# Patient Record
Sex: Female | Born: 1970
Health system: Southern US, Community
[De-identification: ages and names within clinical notes are randomized; demographics above are authoritative.]

## PROBLEM LIST (undated history)

## (undated) DIAGNOSIS — R51 Headache: Secondary | ICD-10-CM

## (undated) DIAGNOSIS — R519 Headache, unspecified: Secondary | ICD-10-CM

## (undated) DIAGNOSIS — I10 Essential (primary) hypertension: Secondary | ICD-10-CM

## (undated) DIAGNOSIS — I729 Aneurysm of unspecified site: Secondary | ICD-10-CM

## (undated) HISTORY — PX: OTHER SURGICAL HISTORY: SHX169

## (undated) HISTORY — DX: Headache: R51

## (undated) HISTORY — PX: FOOT SURGERY: SHX648

## (undated) HISTORY — PX: BARIATRIC SURGERY: SHX1103

## (undated) HISTORY — PX: ABDOMINAL HYSTERECTOMY: SHX81

## (undated) HISTORY — DX: Aneurysm of unspecified site: I72.9

## (undated) HISTORY — PX: CEREBRAL ANEURYSM REPAIR: SHX164

## (undated) HISTORY — PX: HYSTERECTOMY ABDOMINAL WITH SALPINGECTOMY: SHX6725

## (undated) HISTORY — DX: Headache, unspecified: R51.9

---

## 2017-02-26 ENCOUNTER — Emergency Department (HOSPITAL_COMMUNITY): Payer: 59

## 2017-02-26 ENCOUNTER — Emergency Department (HOSPITAL_COMMUNITY)
Admission: EM | Admit: 2017-02-26 | Discharge: 2017-02-26 | Disposition: A | Discharge status: Home / Self Care | Payer: 59 | Attending: Emergency Medicine | Admitting: Emergency Medicine

## 2017-02-26 ENCOUNTER — Encounter (HOSPITAL_COMMUNITY): Payer: Self-pay | Admitting: Emergency Medicine

## 2017-02-26 DIAGNOSIS — Z79899 Other long term (current) drug therapy: Secondary | ICD-10-CM | POA: Diagnosis not present

## 2017-02-26 DIAGNOSIS — I10 Essential (primary) hypertension: Secondary | ICD-10-CM | POA: Diagnosis not present

## 2017-02-26 DIAGNOSIS — J069 Acute upper respiratory infection, unspecified: Secondary | ICD-10-CM

## 2017-02-26 DIAGNOSIS — R05 Cough: Secondary | ICD-10-CM | POA: Diagnosis present

## 2017-02-26 HISTORY — DX: Essential (primary) hypertension: I10

## 2017-02-26 MED ORDER — ACETAMINOPHEN 325 MG PO TABS
650.0000 mg | ORAL_TABLET | Freq: Once | ORAL | Status: AC | PRN
  Administered 2017-02-26: 650 mg via ORAL

## 2017-02-26 MED ORDER — GUAIFENESIN 100 MG/5ML PO LIQD: 100.0000 mg | ORAL | 0 refills | Status: DC | PRN

## 2017-02-26 MED ORDER — OXYMETAZOLINE HCL 0.05 % NA SOLN: 1.0000 | Freq: Two times a day (BID) | NASAL | 0 refills | Status: DC

## 2017-02-26 MED ORDER — AZITHROMYCIN 250 MG PO TABS: 250.0000 mg | ORAL_TABLET | Freq: Every day | ORAL | 0 refills | Status: DC

## 2017-02-26 MED ORDER — ACETAMINOPHEN 325 MG PO TABS
ORAL_TABLET | ORAL | Status: AC
  Filled 2017-02-26: qty 2

## 2017-02-26 NOTE — ED Notes (Signed)
Declined W/C at D/C and was escorted to lobby by RN. 

## 2017-02-26 NOTE — Discharge Instructions (Signed)
Take your medications as prescribed. I also recommend taking Tylenol and ibuprofen as prescribed over-the-counter, alternating between doses every 3-4 hours. Continue drinking fluids at home to remain hydrated. I recommend eating a bland diet for the next few days and taper symptoms have improved. °Follow-up with your primary care provider in the next 3-4 days if symptoms have not improved. °Return to the emergency department if symptoms worsen or new onset of headache, neck stiffness, difficulty breathing, coughing up blood, chest pain, abdominal pain, vomiting, unable to keep fluids down.  °

## 2017-02-26 NOTE — ED Triage Notes (Signed)
p states "on Tuesday I having bad headaches, congestion, cough, aching all over". Pt had fever 100.6. UC did flu swab which was negative. Pt denies headache at this time. They did a CT scan at [facility] which was negative.

## 2017-02-26 NOTE — ED Provider Notes (Signed)
[facility]-EMERGENCY DEPT Provider Note   CSN: [id number] Arrival date & time: 02/26/17  1534 By signing my name below, I, Drue Flirt, attest that this documentation has been prepared under the direction and in the presence of non physician provider,Aneli Zara, PA-C . Electronically Signed: Drue Flirt, Scribe. 02/26/2017. 5:34 PM  History   Chief Complaint Chief Complaint  Patient presents with  . Cough  . Fever   HPI  Candice Hines is a 46 y.o. female with a history of seasonal allergies and HTN who presents to the Emergency Department complaining of intermittent, gradually worsening cough productive of sputum onset 5 days ago. She reports associated watery eyes, congestion, bilateral sinus pain and pressure, generalized body aches, and rhinorrhea. Pt also expresses that her symptoms began with a headache which has since resolved. Pt states she was seen at Urgent Care five days ago for the same where she had a negative flu test, normal head CT and was rx Promethazine DM, tessalon pearls for viral URI. She reports her sxs have worsened since her initial evaluation at urgent care. Pt has taken these medications and Tylenol with no significant relief of symptoms. She is unsure of any fever, but is febrile during exam. She denies any itching to her eyes, sore throat, abdominal pain, SOB, vomiting, diarrhea, otalgia, or ear discharge. No recent sick contact. She also reports taking Allegra for her allergies.   The history is provided by the patient. No language interpreter was used.   Past Medical History:  Diagnosis Date  . Hypertension     There are no active problems to display for this patient.   History reviewed. No pertinent surgical history.  OB History    No data available     Home Medications    Prior to Admission medications   Medication Sig Start Date End Date Taking? Authorizing Provider  azithromycin (ZITHROMAX) 250 MG tablet Take 1 tablet (250 mg total) by mouth  daily. Take first 2 tablets together, then 1 every day until finished. 02/26/17   Barrett Henle, PA-C  guaiFENesin (ROBITUSSIN) 100 MG/5ML liquid Take 5-10 mLs (100-200 mg total) by mouth every 4 (four) hours as needed for cough. 02/26/17   Barrett Henle, PA-C  oxymetazoline (AFRIN NASAL SPRAY) 0.05 % nasal spray Place 1 spray into both nostrils 2 (two) times daily. Spray once into each nostril twice daily for up to the next 3 days. Do not use for more than 3 days to prevent rebound rhinorrhea. 02/26/17   Barrett Henle, PA-C   Family History No family history on file.  Social History Social History  Substance Use Topics  . Smoking status: Not on file  . Smokeless tobacco: Not on file  . Alcohol use No   Allergies   Flexeril [cyclobenzaprine] and Tramadol  Review of Systems Review of Systems  Constitutional: Negative for fever.  HENT: Positive for congestion, rhinorrhea, sinus pain and sinus pressure. Negative for ear pain and sore throat.   Eyes: Negative for itching.  Respiratory: Positive for cough. Negative for shortness of breath.   Cardiovascular: Negative for chest pain.  Gastrointestinal: Negative for abdominal pain, diarrhea and vomiting.  Musculoskeletal: Positive for myalgias.  Neurological: Positive for headaches.   Physical Exam Updated Vital Signs BP 108/76 (BP Location: Right Arm)   Pulse 61   Temp 99.1 F (37.3 C) (Oral)   Resp 20   SpO2 100%   Physical Exam  Constitutional: She is oriented to person, place, and time. She  appears well-developed and well-nourished. No distress.  HENT:  Head: Normocephalic and atraumatic.  Right Ear: Tympanic membrane normal.  Left Ear: Tympanic membrane normal.  Nose: Rhinorrhea present. Right sinus exhibits no maxillary sinus tenderness and no frontal sinus tenderness. Left sinus exhibits no maxillary sinus tenderness and no frontal sinus tenderness.  Mouth/Throat: Uvula is midline, oropharynx is  clear and moist and mucous membranes are normal. No oropharyngeal exudate, posterior oropharyngeal edema, posterior oropharyngeal erythema or tonsillar abscesses. No tonsillar exudate.  Eyes: Conjunctivae and EOM are normal. Pupils are equal, round, and reactive to light. Right eye exhibits no discharge. Left eye exhibits no discharge. No scleral icterus.  Neck: Normal range of motion. Neck supple.  Cardiovascular: Normal rate, regular rhythm, normal heart sounds and intact distal pulses.   Pulmonary/Chest: Effort normal and breath sounds normal. No respiratory distress. She has no wheezes. She has no rales. She exhibits no tenderness.  Abdominal: Soft. Bowel sounds are normal. She exhibits no distension and no mass. There is no tenderness. There is no rebound and no guarding. No hernia.  Musculoskeletal: Normal range of motion. She exhibits no edema or tenderness.  Lymphadenopathy:    She has no cervical adenopathy.  Neurological: She is alert and oriented to person, place, and time.  Skin: Skin is warm and dry. No rash noted. She is not diaphoretic.  Nursing note and vitals reviewed.  ED Treatments / Results  DIAGNOSTIC STUDIES:  Oxygen Saturation is 98% on RA, normal by my interpretation.    COORDINATION OF CARE:  5:05 PM . Discussed treatment plan with medications with pt at bedside and pt agreed to plan.  Labs (all labs ordered are listed, but only abnormal results are displayed) Labs Reviewed - No data to display  EKG  EKG Interpretation None      Radiology Dg Chest 2 View  Result Date: 02/26/2017 CLINICAL DATA:  Throbbing headache.  Backache.  Productive cough. EXAM: CHEST  2 VIEW COMPARISON:  None. FINDINGS: The heart size and mediastinal contours are within normal limits. Both lungs are clear. The visualized skeletal structures are unremarkable. IMPRESSION: No active cardiopulmonary disease. Electronically Signed   By: Gerome Sam III M.D   On: 02/26/2017 16:27     Procedures Procedures (including critical care time)  Medications Ordered in ED Medications  acetaminophen (TYLENOL) 325 MG tablet (not administered)  acetaminophen (TYLENOL) tablet 650 mg (650 mg Oral Given 02/26/17 1559)   Initial Impression / Assessment and Plan / ED Course  I have reviewed the triage vital signs and the nursing notes.  Pertinent labs & imaging results that were available during my care of the patient were reviewed by me and considered in my medical decision making (see chart for details).     Patient presents with worsening cough, nasal congestion, sinus pressure and body aches for the past 6 days. Reports initially being evaluated at urgent care with negative flu test, normal head CT for headache which has since resolved. Initial vitals showed temp 100/6 in triage, pt given tylenol, remaining vitals stable. Exam revealed rhinorrhea. MMM. Lungs clear to auscultation bilaterally. RRR. Abdomen soft, nontender. Chest x-ray negative. Suspect patient's symptoms are likely due to viral URI with associated seasonal allergies however due to patient reporting worsening symptoms over the past 5-6 days will discharged home with Z-Pak for possible sinus infection. Patient also given prescription for decongestant and antitussive. Advised patient to continue taking Tylenol and ibuprofen as needed for fever and bodyaches. Advised patient to follow up  with PCP in 2-3 days. Discussed return precautions.   Final Clinical Impressions(s) / ED Diagnoses   Final diagnoses:  Upper respiratory tract infection, unspecified type    New Prescriptions Discharge Medication List as of 02/26/2017  5:22 PM    START taking these medications   Details  azithromycin (ZITHROMAX) 250 MG tablet Take 1 tablet (250 mg total) by mouth daily. Take first 2 tablets together, then 1 every day until finished., Starting Sat 02/26/2017, Print    guaiFENesin (ROBITUSSIN) 100 MG/5ML liquid Take 5-10 mLs (100-200  mg total) by mouth every 4 (four) hours as needed for cough., Starting Sat 02/26/2017, Print    oxymetazoline (AFRIN NASAL SPRAY) 0.05 % nasal spray Place 1 spray into both nostrils 2 (two) times daily. Spray once into each nostril twice daily for up to the next 3 days. Do not use for more than 3 days to prevent rebound rhinorrhea., Starting Sat 02/26/2017, Print       I personally performed the services described in this documentation, which was scribed in my presence. The recorded information has been reviewed and is accurate.    Satira Sark [place], [place] 02/26/17 1852    Mancel Bale, MD 02/27/17 1225

## 2017-03-21 ENCOUNTER — Ambulatory Visit (INDEPENDENT_AMBULATORY_CARE_PROVIDER_SITE_OTHER): Payer: 59 | Admitting: Neurology

## 2017-03-21 ENCOUNTER — Encounter: Payer: Self-pay | Admitting: Neurology

## 2017-03-21 VITALS — BP 150/85 | HR 63 | Resp 16 | Ht 67.0 in | Wt 194.0 lb

## 2017-03-21 DIAGNOSIS — G9389 Other specified disorders of brain: Secondary | ICD-10-CM | POA: Diagnosis not present

## 2017-03-21 DIAGNOSIS — I671 Cerebral aneurysm, nonruptured: Secondary | ICD-10-CM | POA: Insufficient documentation

## 2017-03-21 NOTE — Progress Notes (Signed)
[facility]    Provider:  Dr Lucia Gaskins Referring Provider: Rayburn Ma, MD Primary Care Physician:  Rayburn Ma, MD  CC:  Encephalomalacia  HPI:  Candice Hines is a 46 y.o. female here as a referral from Dr. Alinda Sierras for encephalomalacia. PMHx HTN and DM. At the beginning of the month she was feeling flu-ish with headache and coughing and weak and fatigued and she was treated with antibiotics. CT of the head showed remote encephalomalacia (I don;t have images ot review) and per report nothing acute but associated with previous aneurysm stenting. She has headaches that haven't changed in quality ot frequency or severity. Worse in stressful situations 3-4 a year and easily treatable with OTC meds. She has numbness in the left arm which started even before the left aneurysm, not worsening. No weakness in the arm. She has bilateral CTS in the hands. No other focal neurologic deficits, associated symptoms, inciting events or modifiable factors.  Reviewed notes, labs and imaging from outside physicians, which showed:  HgbA1c 5.5 01/20/2017 (6.1 in 2015 and 6.9 in 2014).   Reviewed primary care notes, patient was seen for headache in the ED in late March. She has a previous aneurysm clipping and a focus of encephalomalacia in the left posterior frontal area related to hx of aneurysm.   Reviewed CT of the head report 01/2017:  FINDINGS:  There has been a right frontal craniotomy. There is an aneurysm clip at the base of the brain on the right side, most likely on a posterior communicating artery. There is a small focus of encephalomalacia in the left posterior frontal white matter.No hemorrhage No hydrocephalus  Review of Systems: Patient complains of symptoms per HPI as well as the following symptoms: headache, numbness, blurred vision, allergies. Pertinent negatives per HPI. All others negative.   Social History   Social History  . Marital status: Single    Spouse name: N/A    . Number of children: 2  . Years of education: Assoc   Occupational History  . [employer]    Social History Main Topics  . Smoking status: Never Smoker  . Smokeless tobacco: Never Used  . Alcohol use No  . Drug use: No  . Sexual activity: Not on file   Other Topics Concern  . Not on file   Social History Narrative   Drinks caffeine daily    Family History  Problem Relation Age of Onset  . Aneurysm Neg Hx   . Headache Neg Hx     Past Medical History:  Diagnosis Date  . Aneurysm (HCC)   . Headache   . Hypertension     Past Surgical History:  Procedure Laterality Date  . CEREBRAL ANEURYSM REPAIR    . CESAREAN SECTION     x2  . FOOT SURGERY    . HYSTERECTOMY ABDOMINAL WITH SALPINGECTOMY    . SIPS-weight loss sx      Current Outpatient Prescriptions  Medication Sig Dispense Refill  . Biotin 10 MG TABS Take by mouth.    . Calcium 500-100 MG-UNIT CHEW Calcium 500    . Cholecalciferol (VITAMIN D PO) Take by mouth.    . dexlansoprazole (DEXILANT) 60 MG capsule Dexilant 60 mg capsule, delayed release    . ferrous sulfate 325 (65 FE) MG tablet Take by mouth.    . fexofenadine (ALLEGRA) 180 MG tablet Take by mouth.    . Multiple Vitamins-Minerals (MULTIVITAMIN ADULT PO) multivitamin    . valACYclovir (VALTREX) 1000 MG tablet Take by  mouth.     No current facility-administered medications for this visit.     Allergies as of 03/21/2017 - Review Complete 03/21/2017  Allergen Reaction Noted  . Flexeril [cyclobenzaprine]  02/26/2017  . Tramadol  02/26/2017    Vitals: BP (!) 150/85   Pulse 63   Resp 16   Ht 5\' 7"  (1.702 m)   Wt 194 lb (88 kg)   BMI 30.38 kg/m  Last Weight:  Wt Readings from Last 1 Encounters:  03/21/17 194 lb (88 kg)   Last Height:   Ht Readings from Last 1 Encounters:  03/21/17 5\' 7"  (1.702 m)   Physical exam: Exam: Gen: NAD, conversant, well nourised, obese, well groomed                     CV: RRR, no MRG. No Carotid Bruits. No  peripheral edema, warm, nontender Eyes: Conjunctivae clear without exudates or hemorrhage  Neuro: Detailed Neurologic Exam  Speech:    Speech is normal; fluent and spontaneous with normal comprehension.  Cognition:    The patient is oriented to person, place, and time;     recent and remote memory intact;     language fluent;     normal attention, concentration,     fund of knowledge Cranial Nerves:    The pupils are equal, round, and reactive to light. The fundi are normal and spontaneous venous pulsations are present. Visual fields are full to finger confrontation. Extraocular movements are intact. Trigeminal sensation is intact and the muscles of mastication are normal. The face is symmetric. The palate elevates in the midline. Hearing intact. Voice is normal. Shoulder shrug is normal. The tongue has normal motion without fasciculations.   Coordination:    Normal finger to nose and heel to shin. Normal rapid alternating movements.   Gait:    Heel-toe and tandem gait are normal.   Motor Observation:    No asymmetry, no atrophy, and no involuntary movements noted. Tone:    Normal muscle tone.    Posture:    Posture is normal. normal erect    Strength:    Strength is V/V in the upper and lower limbs.      Sensation: intact to LT     Reflex Exam:  DTR's:    Deep tendon reflexes in the upper and lower extremities are normal bilaterally.   Toes:    The toes are downgoing bilaterally.   Clonus:    Clonus is absent.       Assessment/Plan:  46 year old with right aneurysm stenting in 1999, CT of the head last month showed right frontal craniotomy, an aneurysm clip at the base of the brain on the right side, most likely on a posterior communicating artery and a small focus of encephalomalacia in the left posterior frontal white matter. I don't have the images to review, [facility] and if further workup needed will contact patient. Paresthesias in  the left hand/arm: Likely CTS, better with brace, discussed conservative measures. Neuro exam non focal. No new symptoms.   Candice DeanAntonia Nairobi Gustafson, MD  [facility] [address] [place], [place] [id number]  Phone [phone] Fax [phone]

## 2017-03-21 NOTE — Patient Instructions (Signed)
Remember to drink plenty of fluid, eat healthy meals and do not skip any meals. Try to eat protein with a every meal and eat a healthy snack such as fruit or nuts in between meals. Try to keep a regular sleep-wake schedule and try to exercise daily, particularly in the form of walking, 20-30 minutes a day, if you can.   As far as diagnostic testing: Will review CD and call if further testing warranted  Our phone number is [phone]. We also have an after hours call service for urgent matters and there is a physician on-call for urgent questions. For any emergencies you know to call 911 or go to the nearest emergency room

## 2017-03-23 ENCOUNTER — Telehealth: Payer: Self-pay | Admitting: *Deleted

## 2017-03-23 NOTE — Telephone Encounter (Addendum)
Pt will bring a copy of CD by here on  Thursday.R/c pt CD on 03/24/17 Cd on [place] desk.

## 2017-03-30 NOTE — Telephone Encounter (Signed)
CD of head CT placed in Dr. Trevor MaceAhern's box for review. Pt would like disc returned to her.

## 2017-07-20 ENCOUNTER — Emergency Department (HOSPITAL_BASED_OUTPATIENT_CLINIC_OR_DEPARTMENT_OTHER)
Admission: EM | Admit: 2017-07-20 | Discharge: 2017-07-20 | Disposition: A | Discharge status: Home / Self Care | Payer: 59 | Attending: Emergency Medicine | Admitting: Emergency Medicine

## 2017-07-20 ENCOUNTER — Emergency Department (HOSPITAL_COMMUNITY): Admission: EM | Admit: 2017-07-20 | Discharge: 2017-07-20 | Discharge status: Against Medical Advice | Payer: 59

## 2017-07-20 ENCOUNTER — Encounter (HOSPITAL_BASED_OUTPATIENT_CLINIC_OR_DEPARTMENT_OTHER): Payer: Self-pay

## 2017-07-20 ENCOUNTER — Emergency Department (HOSPITAL_BASED_OUTPATIENT_CLINIC_OR_DEPARTMENT_OTHER): Payer: 59

## 2017-07-20 DIAGNOSIS — R51 Headache: Secondary | ICD-10-CM | POA: Insufficient documentation

## 2017-07-20 DIAGNOSIS — R519 Headache, unspecified: Secondary | ICD-10-CM

## 2017-07-20 DIAGNOSIS — Z79899 Other long term (current) drug therapy: Secondary | ICD-10-CM | POA: Diagnosis not present

## 2017-07-20 DIAGNOSIS — I10 Essential (primary) hypertension: Secondary | ICD-10-CM | POA: Insufficient documentation

## 2017-07-20 MED ORDER — DEXAMETHASONE SODIUM PHOSPHATE 10 MG/ML IJ SOLN
10.0000 mg | Freq: Once | INTRAMUSCULAR | Status: AC
  Administered 2017-07-20: 10 mg via INTRAVENOUS
  Filled 2017-07-20: qty 1

## 2017-07-20 MED ORDER — KETOROLAC TROMETHAMINE 30 MG/ML IJ SOLN
30.0000 mg | Freq: Once | INTRAMUSCULAR | Status: AC
  Administered 2017-07-20: 30 mg via INTRAVENOUS
  Filled 2017-07-20: qty 1

## 2017-07-20 MED ORDER — DIPHENHYDRAMINE HCL 50 MG/ML IJ SOLN
25.0000 mg | Freq: Once | INTRAMUSCULAR | Status: AC
  Administered 2017-07-20: 25 mg via INTRAVENOUS
  Filled 2017-07-20: qty 1

## 2017-07-20 MED ORDER — METOCLOPRAMIDE HCL 5 MG/ML IJ SOLN
10.0000 mg | Freq: Once | INTRAMUSCULAR | Status: AC
  Administered 2017-07-20: 10 mg via INTRAVENOUS
  Filled 2017-07-20: qty 2

## 2017-07-20 NOTE — ED Notes (Signed)
Discussed case with EDP James-order for CT head

## 2017-07-20 NOTE — ED Provider Notes (Signed)
[facility]   CSN: [id number] Arrival date & time: 07/20/17  1903     History   Chief Complaint Chief Complaint  Patient presents with  . Headache    HPI Candice Hines is a 46 y.o. female. Chief complaint is headache  HPI:  46 year old female. Headache for 24 hours. She became concerned because his right-sided throbbing headache. She has a history of a previous "leaking aneurysm". She underwent aneurysm clipping in 1999. Has been asymptomatic since then other than occasional mild headaches. States she had migraine headaches prior to her aneurysm. No thunderclap event. No associated nausea and vomiting. Right-sided throbbing. No vision changes. No neck pain. No fever. No numbness weakness or neurological symptoms.  Past Medical History:  Diagnosis Date  . Aneurysm (HCC)   . Headache   . Hypertension     Patient Active Problem List   Diagnosis Date Noted  . Cerebral aneurysm 03/21/2017    Past Surgical History:  Procedure Laterality Date  . CEREBRAL ANEURYSM REPAIR    . CESAREAN SECTION     x2  . FOOT SURGERY    . HYSTERECTOMY ABDOMINAL WITH SALPINGECTOMY    . SIPS-weight loss sx      OB History    No data available       Home Medications    Prior to Admission medications   Medication Sig Start Date End Date Taking? Authorizing Provider  magnesium 30 MG tablet Take 30 mg by mouth 2 (two) times daily.   Yes [provider]  VITAMIN A OP Apply to eye.   Yes [provider]  Biotin 10 MG TABS Take by mouth.    [provider]  Calcium 500-100 MG-UNIT CHEW Calcium 500    [provider]  Cholecalciferol (VITAMIN D PO) Take by mouth.    [provider]  dexlansoprazole (DEXILANT) 60 MG capsule Dexilant 60 mg capsule, delayed release    [provider]  ferrous sulfate 325 (65 FE) MG tablet Take by mouth.    [provider]  fexofenadine (ALLEGRA) 180 MG tablet Take by mouth.     [provider]  Multiple Vitamins-Minerals (MULTIVITAMIN ADULT PO) multivitamin    [provider]  valACYclovir (VALTREX) 1000 MG tablet Take by mouth.    [provider]    Family History Family History  Problem Relation Age of Onset  . Aneurysm Neg Hx   . Headache Neg Hx     Social History Social History  Substance Use Topics  . Smoking status: Never Smoker  . Smokeless tobacco: Never Used  . Alcohol use No     Allergies   Flexeril [cyclobenzaprine] and Tramadol   Review of Systems Review of Systems  Constitutional: Negative for appetite change, chills, diaphoresis, fatigue and fever.  HENT: Negative for mouth sores, sore throat and trouble swallowing.   Eyes: Negative for visual disturbance.  Respiratory: Negative for cough, chest tightness, shortness of breath and wheezing.   Cardiovascular: Negative for chest pain.  Gastrointestinal: Negative for abdominal distention, abdominal pain, diarrhea, nausea and vomiting.  Endocrine: Negative for polydipsia, polyphagia and polyuria.  Genitourinary: Negative for dysuria, frequency and hematuria.  Musculoskeletal: Negative for gait problem.  Skin: Negative for color change, pallor and rash.  Neurological: Positive for headaches. Negative for dizziness, syncope and light-headedness.  Hematological: Does not bruise/bleed easily.  Psychiatric/Behavioral: Negative for behavioral problems and confusion.     Physical Exam Updated Vital Signs BP (!) 158/99 (BP Location:  Left Arm)   Pulse (!) 57   Temp 97.8 F (36.6 C) (Oral)   Resp 18   Ht  (1.702 m)   Wt 88.5 kg (195 lb)   SpO2 100%   BMI 30.54 kg/m   Physical Exam  Constitutional: She is oriented to person, place, and time. She appears well-developed and well-nourished. No distress.  HENT:  Head: Normocephalic.  No specific tenderness over right temporal artery.  Eyes: Pupils are equal, round, and reactive to light. Conjunctivae  are normal. No scleral icterus.  Neck: Normal range of motion. Neck supple. No thyromegaly present.  Cardiovascular: Normal rate and regular rhythm.  Exam reveals no gallop and no friction rub.   No murmur heard. Pulmonary/Chest: Effort normal and breath sounds normal. No respiratory distress. She has no wheezes. She has no rales.  Abdominal: Soft. Bowel sounds are normal. She exhibits no distension. There is no tenderness. There is no rebound.  Musculoskeletal: Normal range of motion.  Neurological: She is alert and oriented to person, place, and time.  Normal symmetric cranial nerves. Normal peripheral neurological exam with normal strength sensation and cerebellar function.  Skin: Skin is warm and dry. No rash noted.  Psychiatric: She has a normal mood and affect. Her behavior is normal.     ED Treatments / Results  Labs (all labs ordered are listed, but only abnormal results are displayed) Labs Reviewed - No data to display  EKG  EKG Interpretation None       Radiology Ct Head Wo Contrast  Result Date: 07/20/2017 CLINICAL DATA:  C/o HA started last night, RIGHT side and radiates around the rest of head. No recent injury. Hx aneurysm clip EXAM: CT HEAD WITHOUT CONTRAST TECHNIQUE: Contiguous axial images were obtained from the base of the skull through the vertex without intravenous contrast. COMPARISON:  None. FINDINGS: Brain: Ventricles are normal in size and configuration. Mild chronic small vessel ischemic change noted within the bilateral periventricular white matter. Focal chronic encephalomalacia noted within the right temporal lobe, adjacent to the aneurysm clip. There is no mass, hemorrhage, edema or other evidence of acute parenchymal abnormality. No midline shift or mass effect. No extra-axial hemorrhage. Vascular: No hyperdense vessel or unexpected calcification. Aneurysm clip in the vicinity of the right ICA terminus. Skull: No acute findings. Surgical changes of previous  right frontoparietal and temporal craniotomy. Sinuses/Orbits: No acute finding. Other: None. IMPRESSION: 1. No acute findings.  No intracranial mass, hemorrhage or edema. 2. Mild chronic ischemic change in the white matter. 3. Aneurysm clip in the vicinity of the right ICA terminus, with associated focal chronic encephalomalacia within the adjacent right temporal lobe. Electronically Signed   By: Bary Richard M.D.   On: 07/20/2017 20:04    Procedures Procedures (including critical care time)  Medications Ordered in ED Medications  diphenhydrAMINE (BENADRYL) injection 25 mg (25 mg Intravenous Given 07/20/17 2145)  metoCLOPramide (REGLAN) injection 10 mg (10 mg Intravenous Given 07/20/17 2138)  ketorolac (TORADOL) 30 MG/ML injection 30 mg (30 mg Intravenous Given 07/20/17 2142)  dexamethasone (DECADRON) injection 10 mg (10 mg Intravenous Given 07/20/17 2312)     Initial Impression / Assessment and Plan / ED Course  I have reviewed the triage vital signs and the nursing notes.  Pertinent labs & imaging results that were available during my care of the patient were reviewed by me and considered in my medical decision making (see chart for details).    *  CT of head negative. Given migraine  cocktail with Benadryl, Toradol, Reglan. Ultimately Decadron. Headache is a 1/10. Appropriate for discharge. Primary care follow-up.     Final Clinical Impressions(s) / ED Diagnoses   Final diagnoses:  Bad headache    New Prescriptions New Prescriptions   No medications on file     Rolland Porter, MD 07/20/17 2337

## 2017-07-20 NOTE — ED Notes (Signed)
C/o ha to rt side of head radiating around head,onset last pm  Denies n/v, no blurred vision

## 2017-07-20 NOTE — ED Triage Notes (Signed)
C/o HA started last night-NAD-steady gait

## 2017-07-20 NOTE — Discharge Instructions (Signed)
Primary care follow-up for any persistent symptoms.

## 2017-07-26 ENCOUNTER — Ambulatory Visit (INDEPENDENT_AMBULATORY_CARE_PROVIDER_SITE_OTHER): Payer: 59 | Admitting: Adult Health

## 2017-07-26 ENCOUNTER — Encounter: Payer: Self-pay | Admitting: Adult Health

## 2017-07-26 VITALS — BP 136/88 | HR 68 | Wt 193.0 lb

## 2017-07-26 DIAGNOSIS — I671 Cerebral aneurysm, nonruptured: Secondary | ICD-10-CM

## 2017-07-26 DIAGNOSIS — G43009 Migraine without aura, not intractable, without status migrainosus: Secondary | ICD-10-CM

## 2017-07-26 MED ORDER — DICLOFENAC POTASSIUM(MIGRAINE) 50 MG PO PACK: PACK | ORAL | 5 refills | Status: AC

## 2017-07-26 NOTE — Patient Instructions (Addendum)
Your Plan:  Continue to monitor your headaches If you develop a headache that does not resolve within 24 hours please call us Take Cambia when headache starts.  If your symptoms worsen or you develop new symptoms please let us know.   Thank you for coming to see [place] at [facility]. I hope we have been able to provide you high quality care today.  You may receive a patient satisfaction survey over the next few weeks. We would appreciate your feedback and comments so that we may continue to improve ourselves and the health of our patients.  Diclofenac powder for oral solution What is this medicine? DICLOFENAC (dye KLOE fen ak) is a non-steroidal anti-inflammatory drug (NSAID). It is used to treat migraine pain. This medicine may be used for other purposes; ask your health care provider or pharmacist if you have questions. COMMON BRAND NAME(S): Cambia What should I tell my health care provider before I take this medicine? They need to know if you have any of these conditions: -asthma, especially aspirin sensitive asthma -coronary artery bypass graft (CABG) surgery within the past 2 weeks -drink more than 3 alcohol-containing drinks a day -heart disease or circulation problems like heart failure or leg edema (fluid retention) -high blood pressure -kidney disease -liver disease -phenylketonuria -stomach problems -an unusual or allergic reaction to diclofenac, aspirin, other NSAIDs, other medicines, foods, dyes, or preservatives -pregnant or trying to get pregnant -breast-feeding How should I use this medicine? Mix this medicine with 1 to 2 ounces of water. Drink the medicine and water together. Follow the directions on the prescription label. Do not take your medicine more often than directed. Long-term, continuous use may increase the risk of heart attack or stroke. A special MedGuide will be given to you by the pharmacist with each prescription and refill. Be sure to  read this information carefully each time. Talk to your pediatrician regarding the use of this medicine in children. Special care may be needed. Elderly patients over 108 years old may have a stronger reaction and need a smaller dose. Overdosage: If you think you have taken too much of this medicine contact a poison control center or emergency room at once. NOTE: This medicine is only for you. Do not share this medicine with others. What if I miss a dose? This does not apply. What may interact with this medicine? Do not take this medicine with any of the following medications: -cidofovir -ketorolac -methotrexate This medicine may also interact with the following medications: -alcohol -aspirin and aspirin-like medicines -cyclosporine -diuretics -lithium -medicines for blood pressure -medicines for osteoporosis -medicines that affect platelets -medicines that treat or prevent blood clots like warfarin -NSAIDs, medicines for pain and inflammation, like ibuprofen or naproxen -pemetrexed -steroid medicines like prednisone or cortisone This list may not describe all possible interactions. Give your health care provider a list of all the medicines, herbs, non-prescription drugs, or dietary supplements you use. Also tell them if you smoke, drink alcohol, or use illegal drugs. Some items may interact with your medicine. What should I watch for while using this medicine? Tell your doctor or health care professional if your pain does not get better. Talk to your doctor before taking another medicine for pain. Do not treat yourself. This medicine does not prevent heart attack or stroke. In fact, this medicine may increase the chance of a heart attack or stroke. The chance may increase with longer use of this medicine and in people who have heart disease. If  you take aspirin to prevent heart attack or stroke, talk with your doctor or health care professional. Do not take medicines such as ibuprofen  and naproxen with this medicine. Side effects such as stomach upset, nausea, or ulcers may be more likely to occur. Many medicines available without a prescription should not be taken with this medicine. This medicine can cause ulcers and bleeding in the stomach and intestines at any time during treatment. Do not smoke cigarettes or drink alcohol. These increase irritation to your stomach and can make it more susceptible to damage from this medicine. Ulcers and bleeding can happen without warning symptoms and can cause death. You may get drowsy or dizzy. Do not drive, use machinery, or do anything that needs mental alertness until you know how this medicine affects you. Do not stand or sit up quickly, especially if you are an older patient. This reduces the risk of dizzy or fainting spells. This medicine can cause you to bleed more easily. Try to avoid damage to your teeth and gums when you brush or floss your teeth. If you take migraine medicines for 10 or more days a month, your migraines may get worse. Keep a diary of headache days and medicine use. Contact your healthcare professional if your migraine attacks occur more frequently. What side effects may I notice from receiving this medicine? Side effects that you should report to your doctor or health care professional as soon as possible: -allergic reactions like skin rash, itching or hives, swelling of the face, lips, or tongue -black or bloody stools, blood in the urine or vomit -blurred vision -chest pain -difficulty breathing or wheezing -nausea or vomiting -fever -redness, blistering, peeling or loosening of the skin, including inside the mouth -slurred speech or weakness on one side of the body -trouble passing urine or change in the amount of urine -unexplained weight gain or swelling -unusually weak or tired -yellowing of eyes or skin Side effects that usually do not require medical attention (report to your doctor or health care  professional if they continue or are bothersome): -constipation -diarrhea -dizziness -headache -heartburn This list may not describe all possible side effects. Call your doctor for medical advice about side effects. You may report side effects to FDA at 1-800-FDA-1088. Where should I keep my medicine? Keep out of the reach of children. Store at room temperature between 15 and 30 degrees C (59 and 86 degrees F). Throw away any unused medicine after the expiration date. NOTE: This sheet is a summary. It may not cover all possible information. If you have questions about this medicine, talk to your doctor, pharmacist, or health care provider.  2018 Elsevier/Gold Standard (2015-11-20 09:56:49)

## 2017-07-26 NOTE — Progress Notes (Signed)
PATIENT: Candice Hines DOB: January 05, 1971  REASON FOR VISIT: follow up- cerebral aneurysm, headaches HISTORY FROM: patient  HISTORY OF PRESENT ILLNESS: Today 07/26/17 Candice Hines is a 31 she'll female with a history of cerebral aneurysm with previous clipping. As well as headaches. She returns today for an evaluation. She states that she has a headache approximately every 3-4 months. He reports that her last headache was in May. She reports this month she began having headaches approximately 4-5 days ago. The headache started in the right temporal region and radiated around the head. She does have photophobia but denies nausea and vomiting. She did eventually go to the emergency room where they did infusions. She states today she is headache free. She did have a CT of the head that did not show any acute changes. She returns today for follow-up.  HISTORY [name] is a 46 y.o. female here as a referral from Dr. Alinda Sierras for encephalomalacia. PMHx HTN and DM. At the beginning of the month she was feeling flu-ish with headache and coughing and weak and fatigued and she was treated with antibiotics. CT of the head showed remote encephalomalacia (I don;t have images ot review) and per report nothing acute but associated with previous aneurysm stenting. She has headaches that haven't changed in quality ot frequency or severity. Worse in stressful situations 3-4 a year and easily treatable with OTC meds. She has numbness in the left arm which started even before the left aneurysm, not worsening. No weakness in the arm. She has bilateral CTS in the hands. No other focal neurologic deficits, associated symptoms, inciting events or modifiable factors.  Reviewed notes, labs and imaging from outside physicians, which showed:  HgbA1c 5.5 01/20/2017 (6.1 in 2015 and 6.9 in 2014).   Reviewed primary care notes, patient was seen for headache in the ED in late March. She has a previous aneurysm clipping and  a focus of encephalomalacia in the left posterior frontal area related to hx of aneurysm.   Reviewed CT of the head report 01/2017:  FINDINGS:  There has been a right frontal craniotomy. There is an aneurysm clip at the base of the brain on the right side, most likely on a posterior communicating artery. There is a small focus of encephalomalacia in the left posterior frontal white matter.No hemorrhage No hydrocephalus   REVIEW OF SYSTEMS: Out of a complete 14 system review of symptoms, the patient complains only of the following symptoms, and all other reviewed systems are negative.  See history of present illness  ALLERGIES: Allergies  Allergen Reactions  . Flexeril [Cyclobenzaprine]   . Tramadol     HOME MEDICATIONS: Outpatient Medications Prior to Visit  Medication Sig Dispense Refill  . Biotin 10 MG TABS Take by mouth.    . Calcium 500-100 MG-UNIT CHEW Calcium 500    . Cholecalciferol (VITAMIN D PO) Take by mouth.    . dexlansoprazole (DEXILANT) 60 MG capsule Dexilant 60 mg capsule, delayed release    . ferrous sulfate 325 (65 FE) MG tablet Take by mouth.    . fexofenadine (ALLEGRA) 180 MG tablet Take by mouth.    . magnesium 30 MG tablet Take 30 mg by mouth 2 (two) times daily.    . Multiple Vitamins-Minerals (MULTIVITAMIN ADULT PO) multivitamin    . valACYclovir (VALTREX) 1000 MG tablet Take by mouth.    Marland Kitchen VITAMIN A OP Apply to eye.     No facility-administered medications prior to visit.     PAST  MEDICAL HISTORY: Past Medical History:  Diagnosis Date  . Aneurysm (HCC)   . Headache   . Hypertension     PAST SURGICAL HISTORY: Past Surgical History:  Procedure Laterality Date  . CEREBRAL ANEURYSM REPAIR    . CESAREAN SECTION     x2  . FOOT SURGERY    . HYSTERECTOMY ABDOMINAL WITH SALPINGECTOMY    . SIPS-weight loss sx      FAMILY HISTORY: Family History  Problem Relation Age of Onset  . Aneurysm Neg Hx   . Headache Neg Hx     SOCIAL  HISTORY: Social History   Social History  . Marital status: Single    Spouse name: N/A  . Number of children: 2  . Years of education: Assoc   Occupational History  . [employer]    Social History Main Topics  . Smoking status: Never Smoker  . Smokeless tobacco: Never Used  . Alcohol use No  . Drug use: No  . Sexual activity: Not on file   Other Topics Concern  . Not on file   Social History Narrative   Drinks caffeine daily      PHYSICAL EXAM  Vitals:   07/26/17 1437  Weight: 193 lb (87.5 kg)   Body mass index is 30.23 kg/m.  Generalized: Well developed, in no acute distress   Neurological examination  Mentation: Alert oriented to time, place, history taking. Follows all commands speech and language fluent Cranial nerve II-XII: Pupils were equal round reactive to light. Extraocular movements were full, visual field were full on confrontational test. Facial sensation and strength were normal. Uvula tongue midline. Head turning and shoulder shrug  were normal and symmetric. Motor: The motor testing reveals 5 over 5 strength of all 4 extremities. Good symmetric motor tone is noted throughout.  Sensory: Sensory testing is intact to soft touch on all 4 extremities. No evidence of extinction is noted.  Coordination: Cerebellar testing reveals good finger-nose-finger and heel-to-shin bilaterally.  Gait and station: Gait is normal. Tandem gait is normal. Romberg is negative. No drift is seen.  Reflexes: Deep tendon reflexes are symmetric and normal bilaterally.   DIAGNOSTIC DATA (LABS, IMAGING, TESTING) - I reviewed patient records, labs, notes, testing and imaging myself where available.  CT HEAD WO CONTRAST 07/20/17:    IMPRESSION: 1. No acute findings.  No intracranial mass, hemorrhage or edema. 2. Mild chronic ischemic change in the white matter. 3. Aneurysm clip in the vicinity of the right ICA terminus, with associated focal chronic encephalomalacia within the  adjacent right temporal lobe.    ASSESSMENT AND PLAN 46 y.o. year old female  has a past medical history of Aneurysm (HCC); Headache; and Hypertension. here with:   1. Migraine  The patient has infrequent headaches. Although she states when she does have a headache he can typically last 4-5 days. Recent CT scan showed no acute changes. I discussed with Dr. Lucia Gaskins. I will give the patient a prescription for Cambia to use at the onset of a headache. I have reviewed Cambia with the patient and provided her with a printout. She is advised that if her headache frequency or severity increases she should let us know.  I spent 15 minutes with the patient. 50% of this time was spent discussing medication.  Butch Penny, MSN, NP-C 07/26/2017, 2:40 PM [facility] [address] [place], [place] [address] [phone]

## 2017-07-27 NOTE — Progress Notes (Signed)
Cambia Rx faxed to [facility].

## 2017-07-28 ENCOUNTER — Telehealth: Payer: Self-pay | Admitting: *Deleted

## 2017-07-28 NOTE — Telephone Encounter (Signed)
Called patient to advise her this RN received fax from [facility] re: Cambia. Advised her that the fax stated they have tried to reach her twice, and she needs to contact them. Patient stated she spoke with [facility] yesterday. This RN advised the fax came today, so [facility] may need further information. She stated she would call them back. Gave her number listed on fax. She verbalized understanding.

## 2017-08-04 NOTE — Telephone Encounter (Signed)
LVM advising patient this RN was calling back in re: to call last week about Cambia. Advised if she has no problems or questions she doesn't have to call back. Left number.

## 2017-08-05 NOTE — Telephone Encounter (Signed)
Received call back form patient stating she has received Cambia in the mail. She verbalized appreciation for call back to check on this.

## 2017-08-31 NOTE — Progress Notes (Signed)
Personally  participated in, made any corrections needed, and agree with history, physical, neuro exam,assessment and plan as stated above.    Dyna Figuereo, MD [facility] 

## 2018-01-29 ENCOUNTER — Emergency Department (HOSPITAL_COMMUNITY)
Admission: EM | Admit: 2018-01-29 | Discharge: 2018-01-29 | Disposition: A | Discharge status: Home / Self Care | Payer: 59 | Attending: Emergency Medicine | Admitting: Emergency Medicine

## 2018-01-29 ENCOUNTER — Other Ambulatory Visit: Payer: Self-pay

## 2018-01-29 ENCOUNTER — Encounter (HOSPITAL_COMMUNITY): Payer: Self-pay | Admitting: *Deleted

## 2018-01-29 DIAGNOSIS — I10 Essential (primary) hypertension: Secondary | ICD-10-CM | POA: Diagnosis not present

## 2018-01-29 DIAGNOSIS — Y929 Unspecified place or not applicable: Secondary | ICD-10-CM | POA: Diagnosis not present

## 2018-01-29 DIAGNOSIS — Y93H2 Activity, gardening and landscaping: Secondary | ICD-10-CM | POA: Insufficient documentation

## 2018-01-29 DIAGNOSIS — W57XXXA Bitten or stung by nonvenomous insect and other nonvenomous arthropods, initial encounter: Secondary | ICD-10-CM | POA: Diagnosis not present

## 2018-01-29 DIAGNOSIS — S1096XA Insect bite of unspecified part of neck, initial encounter: Secondary | ICD-10-CM | POA: Insufficient documentation

## 2018-01-29 DIAGNOSIS — Y999 Unspecified external cause status: Secondary | ICD-10-CM | POA: Diagnosis not present

## 2018-01-29 NOTE — ED Triage Notes (Signed)
Pt states she was working in the yard when she got bit by something.  Pt reports bite on her left calf and upper back near neck.  Pt a/o x 4.  Hasn't taken anything for the "irritation and itching"

## 2018-01-29 NOTE — Discharge Instructions (Addendum)
You were seen in the emergency department today for insect bites.  We are not sure exactly what had bit you. The areas do not look infected.  As discussed please take Benadryl per over-the-counter dosing instructions for any itching you experience.  Apply cool compresses.  Follow-up with your primary care doctor in the next 5 days for recheck of your blood pressure as it was elevated in the emergency department as well as recheck of your insect bites.  Return to the emergency department for new or worsening symptoms including but not limited to fever, chills, spreading redness, trouble breathing, discharge from the areas or any other concerns.

## 2018-01-29 NOTE — ED Provider Notes (Signed)
[facility]-EMERGENCY DEPT Provider Note   CSN: [id number] Arrival date & time: 01/29/18  1932     History   Chief Complaint Chief Complaint  Patient presents with  . Insect Bite    HPI Candice Hines is a 47 y.o. female with a history of hypertension who presents to the emergency department today for insect bites that occurred this morning.  Patient states she was out in the yard working when she felt as though she got bit by something a few times around her neck.  States the areas are initially somewhat painful with the bite, however no longer painful.  States now they are just itchy/irritating.  She has not tried intervention prior to arrival.  No specific alleviating or aggravating factors.  Denies fever, chills, dyspnea, or feeling of throat closing/swelling.  HPI  Past Medical History:  Diagnosis Date  . Aneurysm (HCC)   . Headache   . Hypertension     Patient Active Problem List   Diagnosis Date Noted  . Cerebral aneurysm 03/21/2017    Past Surgical History:  Procedure Laterality Date  . CEREBRAL ANEURYSM REPAIR    . CESAREAN SECTION     x2  . FOOT SURGERY    . HYSTERECTOMY ABDOMINAL WITH SALPINGECTOMY    . SIPS-weight loss sx       OB History   None      Home Medications    Prior to Admission medications   Medication Sig Start Date End Date Taking? Authorizing Provider  Biotin 10 MG TABS Take by mouth.    [provider]  Calcium 500-100 MG-UNIT CHEW Calcium 500    [provider]  Cholecalciferol (VITAMIN D PO) Take by mouth.    [provider]  dexlansoprazole (DEXILANT) 60 MG capsule Dexilant 60 mg capsule, delayed release    [provider]  Diclofenac Potassium (CAMBIA) 50 MG PACK 50 mg take at the onset of headache. 07/26/17   Butch Penny, NP  ferrous sulfate 325 (65 FE) MG tablet Take by mouth.    [provider]  fexofenadine (ALLEGRA) 180 MG tablet Take by mouth.     [provider]  magnesium 30 MG tablet Take 30 mg by mouth 2 (two) times daily.    [provider]  Multiple Vitamins-Minerals (MULTIVITAMIN ADULT PO) multivitamin    [provider]  valACYclovir (VALTREX) 1000 MG tablet Take by mouth.    [provider]  VITAMIN A OP Apply to eye.    [provider]    Family History Family History  Problem Relation Age of Onset  . Hypertension Father   . Diabetes Father   . Aneurysm Neg Hx   . Headache Neg Hx     Social History Social History   Tobacco Use  . Smoking status: Never Smoker  . Smokeless tobacco: Never Used  Substance Use Topics  . Alcohol use: No  . Drug use: No     Allergies   Flexeril [cyclobenzaprine] and Tramadol   Review of Systems Review of Systems  Constitutional: Negative for chills and fever.  HENT: Negative for sore throat and trouble swallowing.   Respiratory: Negative for shortness of breath.   Skin:       Positive for insect bites to neck.   Physical Exam Updated Vital Signs BP (!) 161/111 (BP Location: Right Arm) Comment: Pt states she hasn't taken her BP meds today.  Pulse 78   Temp 98.2 F (36.8 C) (Oral)  Resp 18   SpO2 100%   Physical Exam  Constitutional: She appears well-developed and well-nourished. No distress.  HENT:  Head: Normocephalic and atraumatic.  Mouth/Throat: Uvula is midline and oropharynx is clear and moist.  No angioedema  Eyes: Conjunctivae are normal. Right eye exhibits no discharge. Left eye exhibits no discharge.  Cardiovascular: Normal rate and regular rhythm.  No murmur heard. Pulmonary/Chest: Effort normal and breath sounds normal. No stridor. No respiratory distress. She has no wheezes.  Airway is patent.  Patient is tolerating her own secretions without difficulty.  Neurological: She is alert.  Clear speech.   Skin:  There is one small papule to the posterior aspect of the neck.  There is an additional small papule  to the left lateral aspect of the neck.  No overlying warmth.  No vesicles.  No pustules.  No fluctuance.  No urticaria.  No induration. No foreign body. No tic seen.   Psychiatric: She has a normal mood and affect. Her behavior is normal. Thought content normal.  Nursing note and vitals reviewed.   ED Treatments / Results  Labs (all labs ordered are listed, but only abnormal results are displayed) Labs Reviewed - No data to display  EKG None  Radiology No results found.  Procedures Procedures (including critical care time)  Medications Ordered in ED Medications - No data to display   Initial Impression / Assessment and Plan / ED Course  I have reviewed the triage vital signs and the nursing notes.  Pertinent labs & imaging results that were available during my care of the patient were reviewed by me and considered in my medical decision making (see chart for details).   Patient presents with complaint of insect bites. Patient is nontoxic appearing, BP noted to be elevated, improved throughout visit, doubt HTN emergency, patient aware of need for recheck and antihypertensive medication compliance. Two small papules noted on exam. These areas do not appear to have superimposed infection. Exam not consistent with allergic reaction - no urticaria or angioedema, patient's airway is patent, no respiratory distress. Recommended OTC benadryl and cool compresses with PCP follow up, discussed return precautions. Provided opportunity for questions, patient confirmed understanding and is in agreement with plan.    Final Clinical Impressions(s) / ED Diagnoses   Final diagnoses:  Insect bite, initial encounter    ED Discharge Orders    None       Cherly Andersonetrucelli, Zymeir Salminen R, PA-C 01/29/18 2203    Charlynne PanderYao, David Hsienta, MD 01/29/18 [phone]

## 2020-02-20 ENCOUNTER — Encounter (HOSPITAL_BASED_OUTPATIENT_CLINIC_OR_DEPARTMENT_OTHER): Payer: Self-pay

## 2020-02-20 ENCOUNTER — Emergency Department (HOSPITAL_BASED_OUTPATIENT_CLINIC_OR_DEPARTMENT_OTHER)

## 2020-02-20 ENCOUNTER — Emergency Department (HOSPITAL_BASED_OUTPATIENT_CLINIC_OR_DEPARTMENT_OTHER)
Admission: EM | Admit: 2020-02-20 | Discharge: 2020-02-20 | Disposition: A | Discharge status: Home / Self Care | Attending: Emergency Medicine | Admitting: Emergency Medicine

## 2020-02-20 ENCOUNTER — Other Ambulatory Visit: Payer: Self-pay

## 2020-02-20 DIAGNOSIS — Y999 Unspecified external cause status: Secondary | ICD-10-CM | POA: Diagnosis not present

## 2020-02-20 DIAGNOSIS — S93402A Sprain of unspecified ligament of left ankle, initial encounter: Secondary | ICD-10-CM | POA: Insufficient documentation

## 2020-02-20 DIAGNOSIS — S6392XA Sprain of unspecified part of left wrist and hand, initial encounter: Secondary | ICD-10-CM | POA: Insufficient documentation

## 2020-02-20 DIAGNOSIS — S8011XA Contusion of right lower leg, initial encounter: Secondary | ICD-10-CM | POA: Insufficient documentation

## 2020-02-20 DIAGNOSIS — W19XXXA Unspecified fall, initial encounter: Secondary | ICD-10-CM

## 2020-02-20 DIAGNOSIS — W108XXA Fall (on) (from) other stairs and steps, initial encounter: Secondary | ICD-10-CM | POA: Diagnosis not present

## 2020-02-20 DIAGNOSIS — T1490XA Injury, unspecified, initial encounter: Secondary | ICD-10-CM

## 2020-02-20 DIAGNOSIS — I10 Essential (primary) hypertension: Secondary | ICD-10-CM | POA: Diagnosis not present

## 2020-02-20 DIAGNOSIS — Z79899 Other long term (current) drug therapy: Secondary | ICD-10-CM | POA: Insufficient documentation

## 2020-02-20 DIAGNOSIS — Y9389 Activity, other specified: Secondary | ICD-10-CM | POA: Insufficient documentation

## 2020-02-20 DIAGNOSIS — Y9289 Other specified places as the place of occurrence of the external cause: Secondary | ICD-10-CM | POA: Diagnosis not present

## 2020-02-20 DIAGNOSIS — S63502A Unspecified sprain of left wrist, initial encounter: Secondary | ICD-10-CM

## 2020-02-20 DIAGNOSIS — S99922A Unspecified injury of left foot, initial encounter: Secondary | ICD-10-CM | POA: Diagnosis present

## 2020-02-20 MED ORDER — IBUPROFEN 800 MG PO TABS: 800.0000 mg | ORAL_TABLET | Freq: Three times a day (TID) | ORAL | 0 refills | Status: AC

## 2020-02-20 MED ORDER — KETOROLAC TROMETHAMINE 30 MG/ML IJ SOLN
15.0000 mg | Freq: Once | INTRAMUSCULAR | Status: AC
  Administered 2020-02-20: 15 mg via INTRAMUSCULAR
  Filled 2020-02-20: qty 1

## 2020-02-20 MED FILL — IBUPROFEN 800 MG TAB: 800 | 7 days supply | Qty: 21 | Fill #0

## 2020-02-20 NOTE — Discharge Instructions (Signed)
Take ibuprofen 3 times a day with meals.  Do not take other anti-inflammatories at the same time (Advil, Motrin, naproxen, Aleve). You may supplement with Tylenol if you need further pain control. Use ice packs or heating pads if this helps control your pain. Use the ankle brace and wrist splint as needed for pain control. Follow-up with primary care doctor in 1 week if your symptoms not proving. Return to the emergency room if you develop severe worsening pain, numbness, any new, worsening, or concerning symptoms.

## 2020-02-20 NOTE — ED Triage Notes (Signed)
Pt states she fell down steps ~1210pm-pain to left ankle and left wrist-NAD-slow gait

## 2020-02-20 NOTE — ED Provider Notes (Signed)
[facility] EMERGENCY DEPARTMENT Provider Note   CSN: [id number] Arrival date & time: 02/20/20  1324     History Chief Complaint  Patient presents with  . Fall    Candice Hines is a 49 y.o. female patient presenting for evaluation after fall.  Patient states around 1215 today she missed a step, and fell onto her left ankle and left wrist.  Since then, she has been having left wrist, left ankle, and right shin pain.  She is not taking anything for pain including Tylenol ibuprofen.  Pain is worse with movement and palpation, nothing makes it better.  She denies injury elsewhere, did not hit her head or lose consciousness.  She has ambulated since with mild discomfort.  She is not on blood thinners.  She denies numbness or tingling.  HPI     Past Medical History:  Diagnosis Date  . Aneurysm (HCC)   . Headache   . Hypertension     Patient Active Problem List   Diagnosis Date Noted  . Cerebral aneurysm 03/21/2017    Past Surgical History:  Procedure Laterality Date  . ABDOMINAL HYSTERECTOMY    . BARIATRIC SURGERY    . CEREBRAL ANEURYSM REPAIR    . CESAREAN SECTION     x2  . FOOT SURGERY    . HYSTERECTOMY ABDOMINAL WITH SALPINGECTOMY    . SIPS-weight loss sx       OB History   No obstetric history on file.     Family History  Problem Relation Age of Onset  . Hypertension Father   . Diabetes Father   . Aneurysm Neg Hx   . Headache Neg Hx     Social History   Tobacco Use  . Smoking status: Never Smoker  . Smokeless tobacco: Never Used  Substance Use Topics  . Alcohol use: Yes    Comment: occ  . Drug use: No    Home Medications Prior to Admission medications   Medication Sig Start Date End Date Taking? Authorizing Provider  Biotin 10 MG TABS Take by mouth.    [provider]  Calcium 500-100 MG-UNIT CHEW Calcium 500    [provider]  Cholecalciferol (VITAMIN D PO) Take by mouth.    [provider]    dexlansoprazole (DEXILANT) 60 MG capsule Dexilant 60 mg capsule, delayed release    [provider]  Diclofenac Potassium (CAMBIA) 50 MG PACK 50 mg take at the onset of headache. 07/26/17   Butch Penny, NP  ferrous sulfate 325 (65 FE) MG tablet Take by mouth.    [provider]  fexofenadine (ALLEGRA) 180 MG tablet Take by mouth.    [provider]  ibuprofen (ADVIL) 800 MG tablet Take 1 tablet (800 mg total) by mouth 3 (three) times daily with meals. 02/20/20   Sariah Henkin, PA-C  magnesium 30 MG tablet Take 30 mg by mouth 2 (two) times daily.    [provider]  Multiple Vitamins-Minerals (MULTIVITAMIN ADULT PO) multivitamin    [provider]  valACYclovir (VALTREX) 1000 MG tablet Take by mouth.    [provider]  VITAMIN A OP Apply to eye.    [provider]    Allergies    Flexeril [cyclobenzaprine] and Tramadol  Review of Systems   Review of Systems  Musculoskeletal: Positive for arthralgias.  Neurological: Negative for numbness.  Hematological: Does not bruise/bleed easily.    Physical Exam Updated Vital Signs BP (!) 153/90 (BP Location: Right Arm)  Pulse 60   Temp 98.4 F (36.9 C) (Oral)   Resp 16   Ht 5\' 6"  (1.676 m)   Wt 103 kg   SpO2 99%   BMI 36.64 kg/m   Physical Exam Vitals and nursing note reviewed.  Constitutional:      General: She is not in acute distress.    Appearance: She is well-developed.  HENT:     Head: Normocephalic and atraumatic.  Pulmonary:     Effort: Pulmonary effort is normal.  Abdominal:     General: There is no distension.  Musculoskeletal:        General: Tenderness present. Normal range of motion.     Cervical back: Normal range of motion.     Comments: Full active range of motion of the left wrist and ankle with mild discomfort.  Radial pedal pulses 2+ bilaterally.  No significant deformity, mild swelling of the left ankle, no swelling of the left wrist.  No  tenderness palpation of the left lower leg or the left forearm.  Good distal sensation and cap refill of all extremities. Contusion of the mid right shin.  No tenderness palpation of the tibia proximally or distally to the contusion.  No deformity.  Skin:    General: Skin is warm.     Findings: No rash.  Neurological:     Mental Status: She is alert and oriented to person, place, and time.     ED Results / Procedures / Treatments   Labs (all labs ordered are listed, but only abnormal results are displayed) Labs Reviewed - No data to display  EKG None  Radiology DG Wrist Complete Left  Result Date: 02/20/2020 CLINICAL DATA:  Left wrist pain after a fall on stairs today. Initial encounter. EXAM: LEFT WRIST - COMPLETE 3+ VIEW COMPARISON:  None. FINDINGS: No acute bony or joint abnormality is seen. There is some cortical thickening along the distal diaphysis of the ulna which is likely due to a remote healed fracture. Soft tissues are unremarkable. IMPRESSION: No acute abnormality. Electronically Signed   By: Inge Rise M.D.   On: 02/20/2020 14:34   DG Ankle Complete Left  Result Date: 02/20/2020 CLINICAL DATA:  Left ankle injury s/p missing step on stairs today, pt states medial malleolus pain, no previous injuryinjury EXAM: LEFT ANKLE COMPLETE - 3+ VIEW COMPARISON:  None. FINDINGS: Ankle mortise intact. The talar dome is normal. No malleolar fracture. The calcaneus is normal. IMPRESSION: No fracture or dislocation. Electronically Signed   By: Suzy Bouchard M.D.   On: 02/20/2020 14:35    Procedures Procedures (including critical care time)  Medications Ordered in ED Medications  ketorolac (TORADOL) 30 MG/ML injection 15 mg (15 mg Intramuscular Given 02/20/20 1610)    ED Course  I have reviewed the triage vital signs and the nursing notes.  Pertinent labs & imaging results that were available during my care of the patient were reviewed by me and considered in my medical  decision making (see chart for details).    MDM Rules/Calculators/A&P                      Presented for evaluation after fall.  On exam, patient appears nontoxic.  She is neurovascularly intact.  X-rays obtained from triage read interpreted by me, no fracture dislocation of the ankle or wrist.  Additionally, patient has a contusion of the right shin, exam is not consistent with tibial fracture.  Discussed findings with patient.  Discussed antibiotic  treatment with immobilization, NSAIDs, Tylenol, ice.  Encourage follow-up with primary care if symptoms not improving.  At this time, patient appears safe for discharge.  Return precautions given.  Patient states she understands and agrees to plan.  Final Clinical Impression(s) / ED Diagnoses Final diagnoses:  Sprain of left ankle, unspecified ligament, initial encounter  Sprain of left wrist, initial encounter  Contusion of right lower leg, initial encounter  Fall, initial encounter    Rx / DC Orders ED Discharge Orders         Ordered    ibuprofen (ADVIL) 800 MG tablet  3 times daily with meals     02/20/20 1538           Carl Bleecker, PA-C 02/20/20 1641    Arby Barrette, MD 02/25/20 2346

## 2020-06-18 ENCOUNTER — Other Ambulatory Visit: Payer: Self-pay

## 2020-06-18 ENCOUNTER — Ambulatory Visit
Admission: EM | Admit: 2020-06-18 | Discharge: 2020-06-18 | Disposition: A | Discharge status: Home / Self Care | Payer: 59

## 2020-06-18 DIAGNOSIS — W57XXXA Bitten or stung by nonvenomous insect and other nonvenomous arthropods, initial encounter: Secondary | ICD-10-CM | POA: Diagnosis not present

## 2020-06-18 DIAGNOSIS — S20469A Insect bite (nonvenomous) of unspecified back wall of thorax, initial encounter: Secondary | ICD-10-CM

## 2020-06-18 DIAGNOSIS — R21 Rash and other nonspecific skin eruption: Secondary | ICD-10-CM | POA: Diagnosis not present

## 2020-06-18 MED ORDER — PREDNISONE 10 MG PO TABS: 20.0000 mg | ORAL_TABLET | Freq: Every day | ORAL | 0 refills | Status: AC

## 2020-06-18 MED ORDER — DOXYCYCLINE HYCLATE 100 MG PO CAPS: 100.0000 mg | ORAL_CAPSULE | Freq: Two times a day (BID) | ORAL | 0 refills | Status: AC

## 2020-06-18 NOTE — ED Triage Notes (Signed)
Pt presents with multiple insect bites to back after moving in new house

## 2020-06-18 NOTE — Discharge Instructions (Addendum)
Prescribed doxycycline and prednisone Take as prescribed and to completion Limit hot shower and baths, or bathe with warm water.   Moisturize skin daily Follow up with PCP if symptoms persists Return or go to the ER if you have any new or worsening symptoms

## 2020-06-18 NOTE — ED Provider Notes (Signed)
[facility] CARE CENTER   [id number] 06/18/20 Arrival Time: 1029   Chief Complaint  Patient presents with   Insect Bite     SUBJECTIVE: History from: patient.  Candice Hines is a 49 y.o. female who presented to the urgent care for complaint of insect bite that occurred last night.  Denies a precipitating event, noticed while changing clothes.  Localizes the bite to her back.  Patient is not sure what insect bite her but believes more likely of a spider bite.  Report history of previous insect bite.  Has tried OTC peroxide without relief.  Denies fever, chills, nausea, vomiting, headache, dizziness, weakness, fatigue, rash, or abdominal pain.   ROS: As per HPI.  All other pertinent ROS negative.     Past Medical History:  Diagnosis Date   Aneurysm (HCC)    Headache    Hypertension    Past Surgical History:  Procedure Laterality Date   ABDOMINAL HYSTERECTOMY     BARIATRIC SURGERY     CEREBRAL ANEURYSM REPAIR     CESAREAN SECTION     x2   FOOT SURGERY     HYSTERECTOMY ABDOMINAL WITH SALPINGECTOMY     SIPS-weight loss sx     Allergies  Allergen Reactions   Flexeril [Cyclobenzaprine]    Tramadol    No current facility-administered medications on file prior to encounter.   Current Outpatient Medications on File Prior to Encounter  Medication Sig Dispense Refill   atorvastatin (LIPITOR) 40 MG tablet Take by mouth.     Biotin 10 MG TABS Take by mouth.     Calcium 500-100 MG-UNIT CHEW Calcium 500     Cholecalciferol (VITAMIN D PO) Take by mouth.     dexlansoprazole (DEXILANT) 60 MG capsule Dexilant 60 mg capsule, delayed release     Diclofenac Potassium (CAMBIA) 50 MG PACK 50 mg take at the onset of headache. 1 each 5   estradiol (VIVELLE-DOT) 0.1 MG/24HR patch 1 patch 2 (two) times a week.     ferrous sulfate 325 (65 FE) MG tablet Take by mouth.     fexofenadine (ALLEGRA) 180 MG tablet Take by mouth.     ibuprofen (ADVIL) 800 MG tablet Take 1  tablet (800 mg total) by mouth 3 (three) times daily with meals. 21 tablet 0   magnesium 30 MG tablet Take 30 mg by mouth 2 (two) times daily.     Multiple Vitamins-Minerals (MULTIVITAMIN ADULT PO) multivitamin     valACYclovir (VALTREX) 1000 MG tablet Take by mouth.     VITAMIN A OP Apply to eye.     Social History   Socioeconomic History   Marital status: Single    Spouse name: Not on file   Number of children: 2   Years of education: Assoc   Highest education level: Not on file  Occupational History   Occupation: [employer]  Tobacco Use   Smoking status: Never Smoker   Smokeless tobacco: Never Used  Building services engineer Use: Never used  Substance and Sexual Activity   Alcohol use: Yes    Comment: occ   Drug use: No   Sexual activity: Not on file  Other Topics Concern   Not on file  Social History Narrative   Drinks caffeine daily   Social Determinants of Health   Financial Resource Strain:    Difficulty of Paying Living Expenses:   Food Insecurity:    Worried About Radiation protection practitioner of Food in the Last Year:    [employer]  of Food in the Last Year:   Transportation Needs:    Freight forwarder (Medical):    Lack of Transportation (Non-Medical):   Physical Activity:    Days of Exercise per Week:    Minutes of Exercise per Session:   Stress:    Feeling of Stress :   Social Connections:    Frequency of Communication with Friends and Family:    Frequency of Social Gatherings with Friends and Family:    Attends Religious Services:    Active Member of Clubs or Organizations:    Attends Engineer, structural:    Marital Status:   Intimate Partner Violence:    Fear of Current or Ex-Partner:    Emotionally Abused:    Physically Abused:    Sexually Abused:    Family History  Problem Relation Age of Onset   Hypertension Father    Diabetes Father    Aneurysm Neg Hx    Headache Neg Hx     OBJECTIVE:  Vitals:   06/18/20 1052    BP: 124/85  Pulse: (!) 53  Resp: 20  Temp: 98.6 F (37 C)  SpO2: 98%     Physical Exam Vitals and nursing note reviewed.  Constitutional:      General: She is not in acute distress.    Appearance: Normal appearance. She is normal weight. She is not ill-appearing, toxic-appearing or diaphoretic.  HENT:     Head: Normocephalic.  Cardiovascular:     Rate and Rhythm: Normal rate and regular rhythm.     Pulses: Normal pulses.     Heart sounds: Normal heart sounds. No murmur heard.  No friction rub. No gallop.   Pulmonary:     Effort: Pulmonary effort is normal. No respiratory distress.     Breath sounds: Normal breath sounds. No stridor. No wheezing, rhonchi or rales.  Chest:     Chest wall: No tenderness.  Skin:    Findings: Erythema and rash present. Rash is macular, urticarial and vesicular.     Comments: vesicular and macular rash located on back  Neurological:     Mental Status: She is alert and oriented to person, place, and time.     LABS:  No results found for this or any previous visit (from the past 24 hour(s)).   ASSESSMENT & PLAN:  1. Insect bite of back wall of thorax, unspecified location, initial encounter   2. Rash in adult     Meds ordered this encounter  Medications   doxycycline (VIBRAMYCIN) 100 MG capsule    Sig: Take 1 capsule (100 mg total) by mouth 2 (two) times daily.    Dispense:  20 capsule    Refill:  0   predniSONE (DELTASONE) 10 MG tablet    Sig: Take 2 tablets (20 mg total) by mouth daily.    Dispense:  15 tablet    Refill:  0    Discharge Instructions    Prescribed doxycycline and prednisone Take as prescribed and to completion Limit hot shower and baths, or bathe with warm water.   Moisturize skin daily Follow up with PCP if symptoms persists Return or go to the ER if you have any new or worsening symptoms  Reviewed expectations re: course of current medical issues. Questions answered. Outlined signs and symptoms  indicating need for more acute intervention. Patient verbalized understanding. After Visit Summary given.    Note: This document was prepared using Dragon voice recognition software and may include unintentional dictation errors.  Durward Parcel, FNP 06/18/20 1124

## 2020-10-20 ENCOUNTER — Other Ambulatory Visit: Payer: Self-pay

## 2020-10-20 ENCOUNTER — Encounter (HOSPITAL_BASED_OUTPATIENT_CLINIC_OR_DEPARTMENT_OTHER): Payer: Self-pay | Admitting: Emergency Medicine

## 2020-10-20 ENCOUNTER — Emergency Department (HOSPITAL_COMMUNITY): Admission: EM | Admit: 2020-10-20 | Discharge: 2020-10-20 | Discharge status: Against Medical Advice | Payer: 59

## 2020-10-20 ENCOUNTER — Emergency Department (HOSPITAL_BASED_OUTPATIENT_CLINIC_OR_DEPARTMENT_OTHER)
Admission: EM | Admit: 2020-10-20 | Discharge: 2020-10-20 | Disposition: A | Discharge status: Home / Self Care | Payer: 59 | Attending: Emergency Medicine | Admitting: Emergency Medicine

## 2020-10-20 DIAGNOSIS — M545 Low back pain, unspecified: Secondary | ICD-10-CM

## 2020-10-20 DIAGNOSIS — Z79899 Other long term (current) drug therapy: Secondary | ICD-10-CM | POA: Diagnosis not present

## 2020-10-20 DIAGNOSIS — I10 Essential (primary) hypertension: Secondary | ICD-10-CM | POA: Diagnosis not present

## 2020-10-20 MED ORDER — KETOROLAC TROMETHAMINE 15 MG/ML IJ SOLN
15.0000 mg | Freq: Once | INTRAMUSCULAR | Status: AC
  Administered 2020-10-20: 03:00:00 15 mg via INTRAMUSCULAR
  Filled 2020-10-20: qty 1

## 2020-10-20 MED ORDER — DIAZEPAM 5 MG PO TABS
5.0000 mg | ORAL_TABLET | Freq: Once | ORAL | Status: AC
  Administered 2020-10-20: 03:00:00 5 mg via ORAL
  Filled 2020-10-20: qty 1

## 2020-10-20 MED ORDER — IBUPROFEN 600 MG PO TABS: 600.0000 mg | ORAL_TABLET | Freq: Four times a day (QID) | ORAL | 0 refills | Status: AC | PRN

## 2020-10-20 NOTE — ED Notes (Signed)
Pt left saying she was going to go to [facility] instead because they didn't have anyone in their lobby due to not being seen quick enough

## 2020-10-20 NOTE — ED Provider Notes (Signed)
[facility] EMERGENCY DEPARTMENT Provider Note   CSN: [id number] Arrival date & time: 10/20/20  0155     History Chief Complaint  Patient presents with   Back Pain    Candice Hines is a 49 y.o. female.  HPI     This 49 year old female with a history of bariatric surgery, hypertension, cerebral aneurysm who presents with back and groin pain.  Patient reports she had onset of lower back pain that radiates around into her upper thighs and groin on both legs.  She reports onset of pain today.  No history of the same.  She reports that she spent the day cleaning and moving furniture.  Pain is worse when she ambulates or moves in certain directions.  Denies numbness or tingling of the lower extremities.  Denies bowel or bladder difficulty.  No weakness.  She rates her pain 8 out of 10.  She took a hydrocodone at home with minimal relief.  Past Medical History:  Diagnosis Date   Aneurysm (HCC)    Headache    Hypertension     Patient Active Problem List   Diagnosis Date Noted   Cerebral aneurysm 03/21/2017    Past Surgical History:  Procedure Laterality Date   ABDOMINAL HYSTERECTOMY     BARIATRIC SURGERY     CEREBRAL ANEURYSM REPAIR     CESAREAN SECTION     x2   FOOT SURGERY     HYSTERECTOMY ABDOMINAL WITH SALPINGECTOMY     SIPS-weight loss sx       OB History   No obstetric history on file.     Family History  Problem Relation Age of Onset   Hypertension Father    Diabetes Father    Aneurysm Neg Hx    Headache Neg Hx     Social History   Tobacco Use   Smoking status: Never Smoker   Smokeless tobacco: Never Used  Vaping Use   Vaping Use: Never used  Substance Use Topics   Alcohol use: Yes    Comment: occ   Drug use: No    Home Medications Prior to Admission medications   Medication Sig Start Date End Date Taking? Authorizing Provider  atorvastatin (LIPITOR) 40 MG tablet Take by mouth.    [provider]   Biotin 10 MG TABS Take by mouth.    [provider]  Calcium 500-100 MG-UNIT CHEW Calcium 500    [provider]  Cholecalciferol (VITAMIN D PO) Take by mouth.    [provider]  dexlansoprazole (DEXILANT) 60 MG capsule Dexilant 60 mg capsule, delayed release    [provider]  Diclofenac Potassium (CAMBIA) 50 MG PACK 50 mg take at the onset of headache. 07/26/17   Butch Penny, NP  doxycycline (VIBRAMYCIN) 100 MG capsule Take 1 capsule (100 mg total) by mouth 2 (two) times daily. 06/18/20   Avegno, Zachery Dakins, FNP  estradiol (VIVELLE-DOT) 0.1 MG/24HR patch 1 patch 2 (two) times a week. 01/24/20   [provider]  ferrous sulfate 325 (65 FE) MG tablet Take by mouth.    [provider]  fexofenadine (ALLEGRA) 180 MG tablet Take by mouth.    [provider]  ibuprofen (ADVIL) 600 MG tablet Take 1 tablet (600 mg total) by mouth every 6 (six) hours as needed. 10/20/20   Trevar Boehringer, Mayer Masker, MD  ibuprofen (ADVIL) 800 MG tablet Take 1 tablet (800 mg total) by mouth 3 (three) times daily with meals. 02/20/20   Caccavale, Sophia,  PA-C  magnesium 30 MG tablet Take 30 mg by mouth 2 (two) times daily.    [provider]  Multiple Vitamins-Minerals (MULTIVITAMIN ADULT PO) multivitamin    [provider]  predniSONE (DELTASONE) 10 MG tablet Take 2 tablets (20 mg total) by mouth daily. 06/18/20   Avegno, Zachery Dakins, FNP  valACYclovir (VALTREX) 1000 MG tablet Take by mouth.    [provider]  VITAMIN A OP Apply to eye.    [provider]    Allergies    Flexeril [cyclobenzaprine] and Tramadol  Review of Systems   Review of Systems  Constitutional: Negative for fever.  Genitourinary: Negative for difficulty urinating.  Musculoskeletal: Positive for back pain.  Neurological: Negative for weakness and numbness.  All other systems reviewed and are negative.   Physical Exam Updated Vital Signs BP (!)  147/81    Pulse 62    Temp 97.8 F (36.6 C) (Oral)    Resp 14    Ht 1.702 m (5\' 7" )    Wt 97.5 kg    SpO2 100%    BMI 33.67 kg/m   Physical Exam Vitals and nursing note reviewed.  Constitutional:      Appearance: She is well-developed and well-nourished. She is obese. She is not ill-appearing.  HENT:     Head: Normocephalic and atraumatic.     Nose: Nose normal.     Mouth/Throat:     Mouth: Mucous membranes are moist.  Eyes:     Pupils: Pupils are equal, round, and reactive to light.  Cardiovascular:     Rate and Rhythm: Normal rate and regular rhythm.     Heart sounds: Normal heart sounds.  Pulmonary:     Effort: Pulmonary effort is normal. No respiratory distress.     Breath sounds: No wheezing.  Abdominal:     General: Bowel sounds are normal.     Palpations: Abdomen is soft.     Tenderness: There is no abdominal tenderness. There is no guarding or rebound.  Musculoskeletal:     Cervical back: Neck supple.     Comments: No obvious deformities, normal range of motion bilateral hips, negative bilateral straight leg raises, no midline tenderness palpation, step-off, deformity of the T or L-spine  Skin:    General: Skin is warm and dry.  Neurological:     Mental Status: She is alert and oriented to person, place, and time.     Comments: 5 out of 5 strength bilateral lower extremities  Psychiatric:        Mood and Affect: Mood and affect and mood normal.     ED Results / Procedures / Treatments   Labs (all labs ordered are listed, but only abnormal results are displayed) Labs Reviewed - No data to display  EKG None  Radiology No results found.  Procedures Procedures (including critical care time)  Medications Ordered in ED Medications  ketorolac (TORADOL) 15 MG/ML injection 15 mg (15 mg Intramuscular Given 10/20/20 0234)  diazepam (VALIUM) tablet 5 mg (5 mg Oral Given 10/20/20 0234)    ED Course  I have reviewed the triage vital signs and the nursing  notes.  Pertinent labs & imaging results that were available during my care of the patient were reviewed by me and considered in my medical decision making (see chart for details).    MDM Rules/Calculators/A&P  Patient presents with back pain that radiates around to the upper thighs.  She is overall nontoxic vital signs are reassuring.  Pain is worse with walking and movement.  This is highly suggestive of musculoskeletal etiology.  She denies symptoms consistent with sciatica.  Denies urinary symptoms.  No signs or symptoms of cauda equina.  Will give anti-inflammatory muscle relaxant.  No indication for imaging at this time.  Recommend adding anti-inflammatories to her regimen.  After history, exam, and medical workup I feel the patient has been appropriately medically screened and is safe for discharge home. Pertinent diagnoses were discussed with the patient. Patient was given return precautions.  Final Clinical Impression(s) / ED Diagnoses Final diagnoses:  Acute bilateral low back pain without sciatica    Rx / DC Orders ED Discharge Orders         Ordered    ibuprofen (ADVIL) 600 MG tablet  Every 6 hours PRN        10/20/20 0313           Shon Baton, MD 10/20/20 [phone]

## 2020-10-20 NOTE — Discharge Instructions (Addendum)
You were seen today for back and groin pain.  Your pain seems musculoskeletal in nature.  Add anti-inflammatory such as naproxen or ibuprofen to your pain regimen.  No heavy lifting until pain has improved.

## 2020-10-20 NOTE — ED Triage Notes (Signed)
Reports low back pain that radiates below buttocks and wraps around thighs on both legs.  Reports taking vicodin at home with no relief.

## 2020-11-06 IMAGING — DX DG ANKLE COMPLETE 3+V*L*
3 series · 3 of 3 positions shown · non-contrast
Comparison: None.

CLINICAL DATA: Left ankle injury s/p missing step on stairs today,
pt states medial malleolus pain, no previous injuryinjury

EXAM:
LEFT ANKLE COMPLETE - 3+ VIEW

[ankle ap]
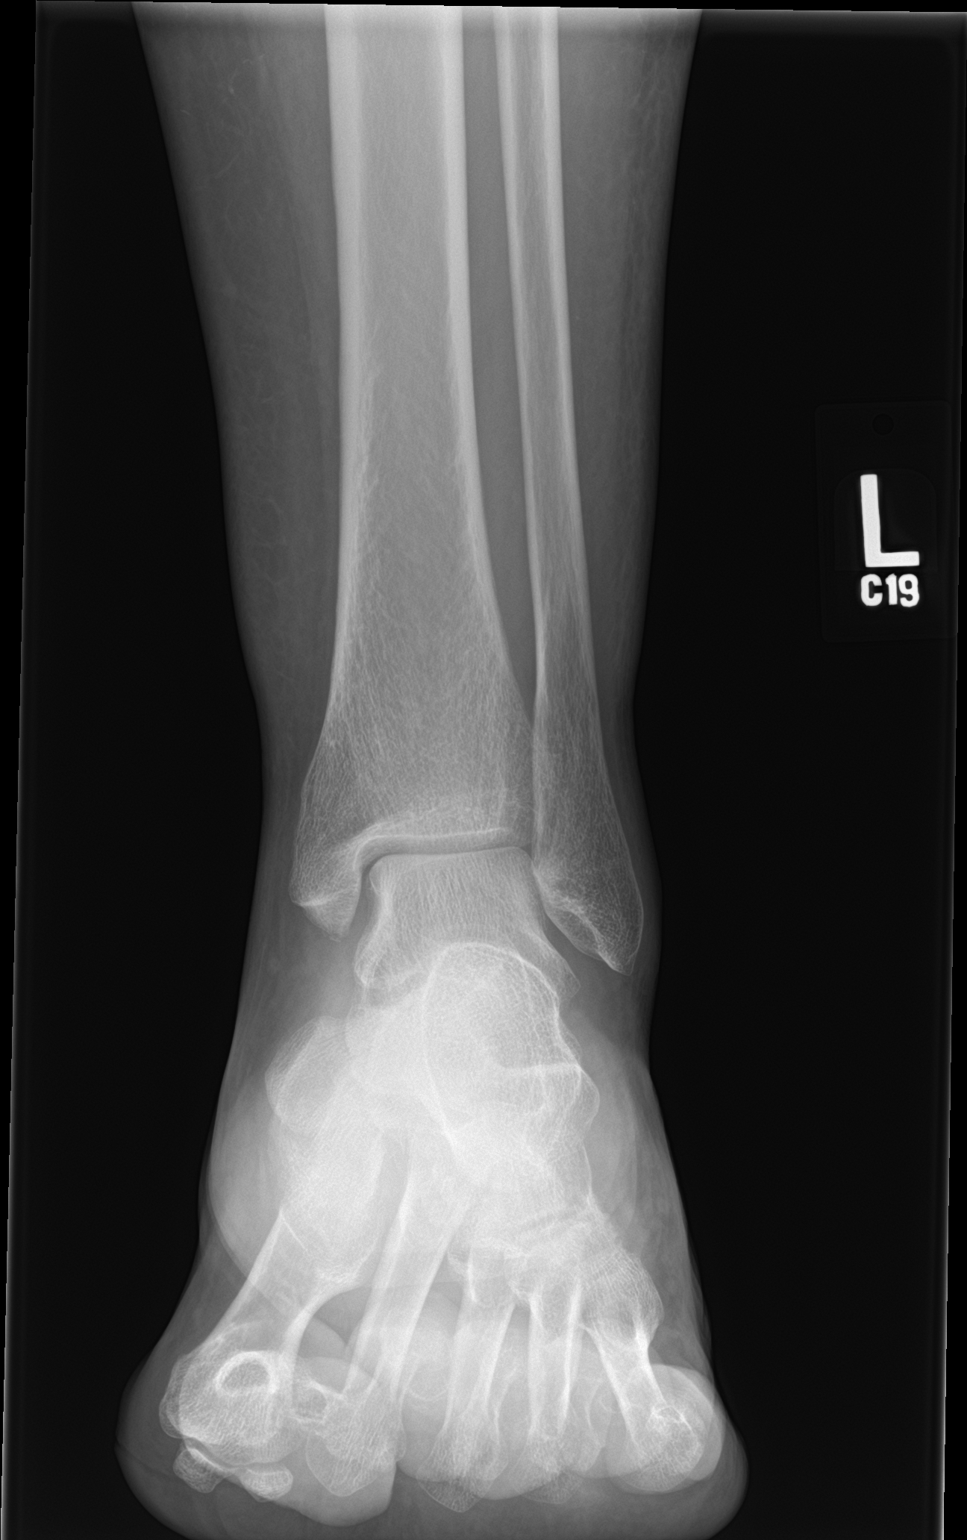

[ankle obl]
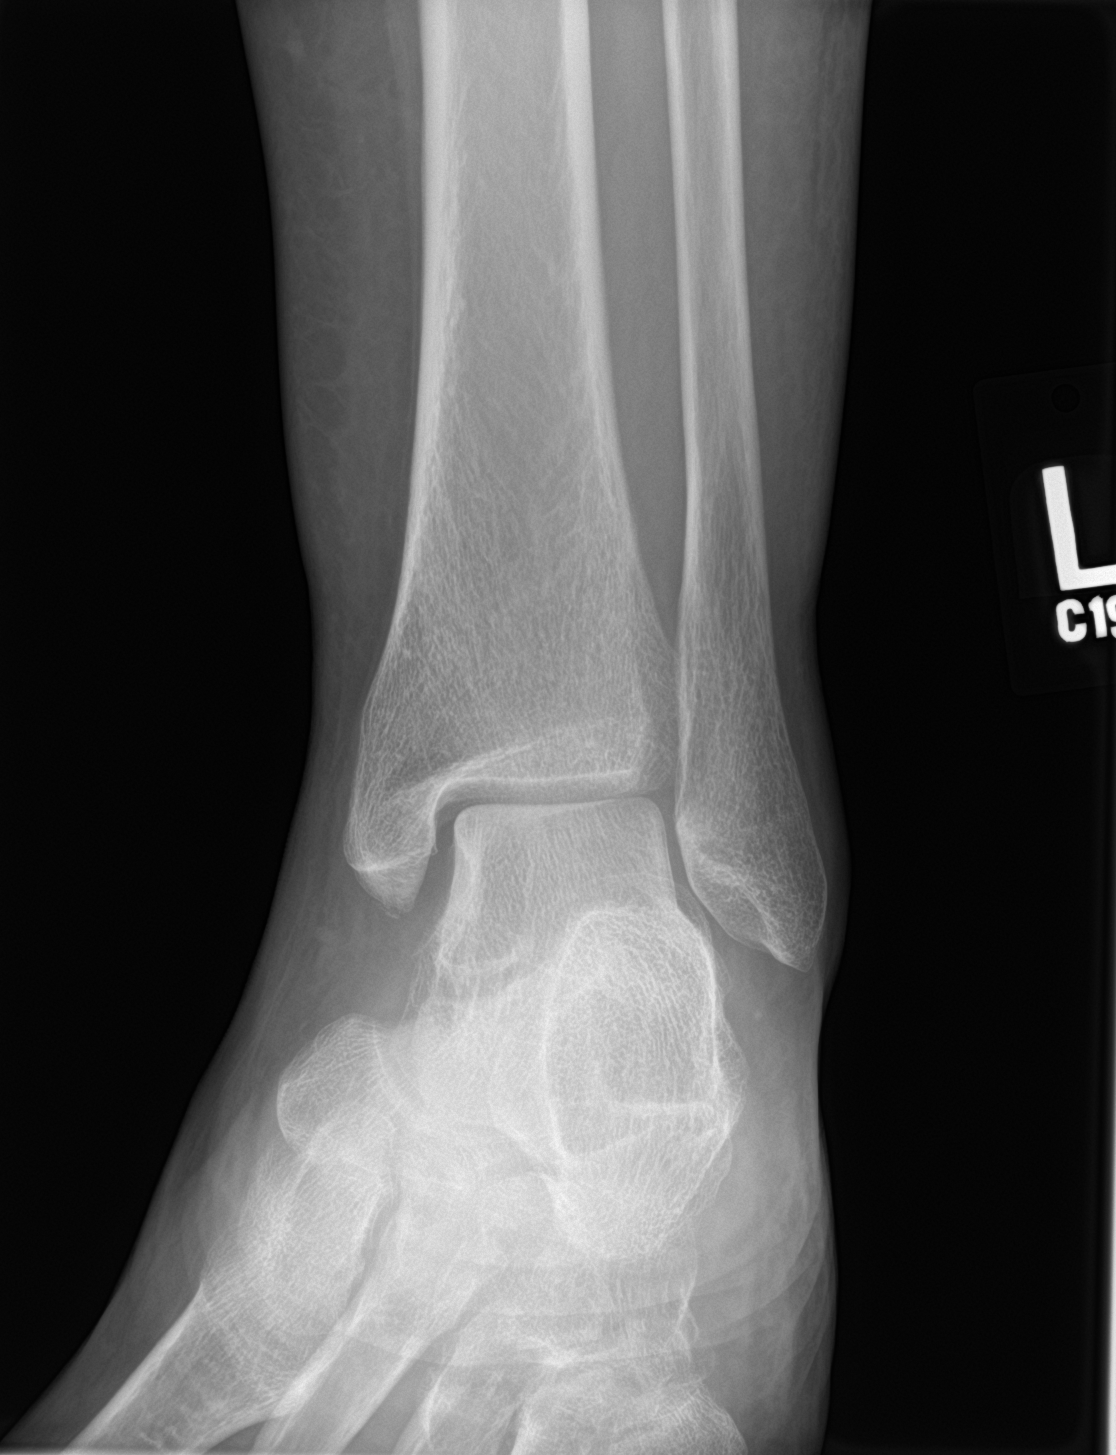

[ankle lat]
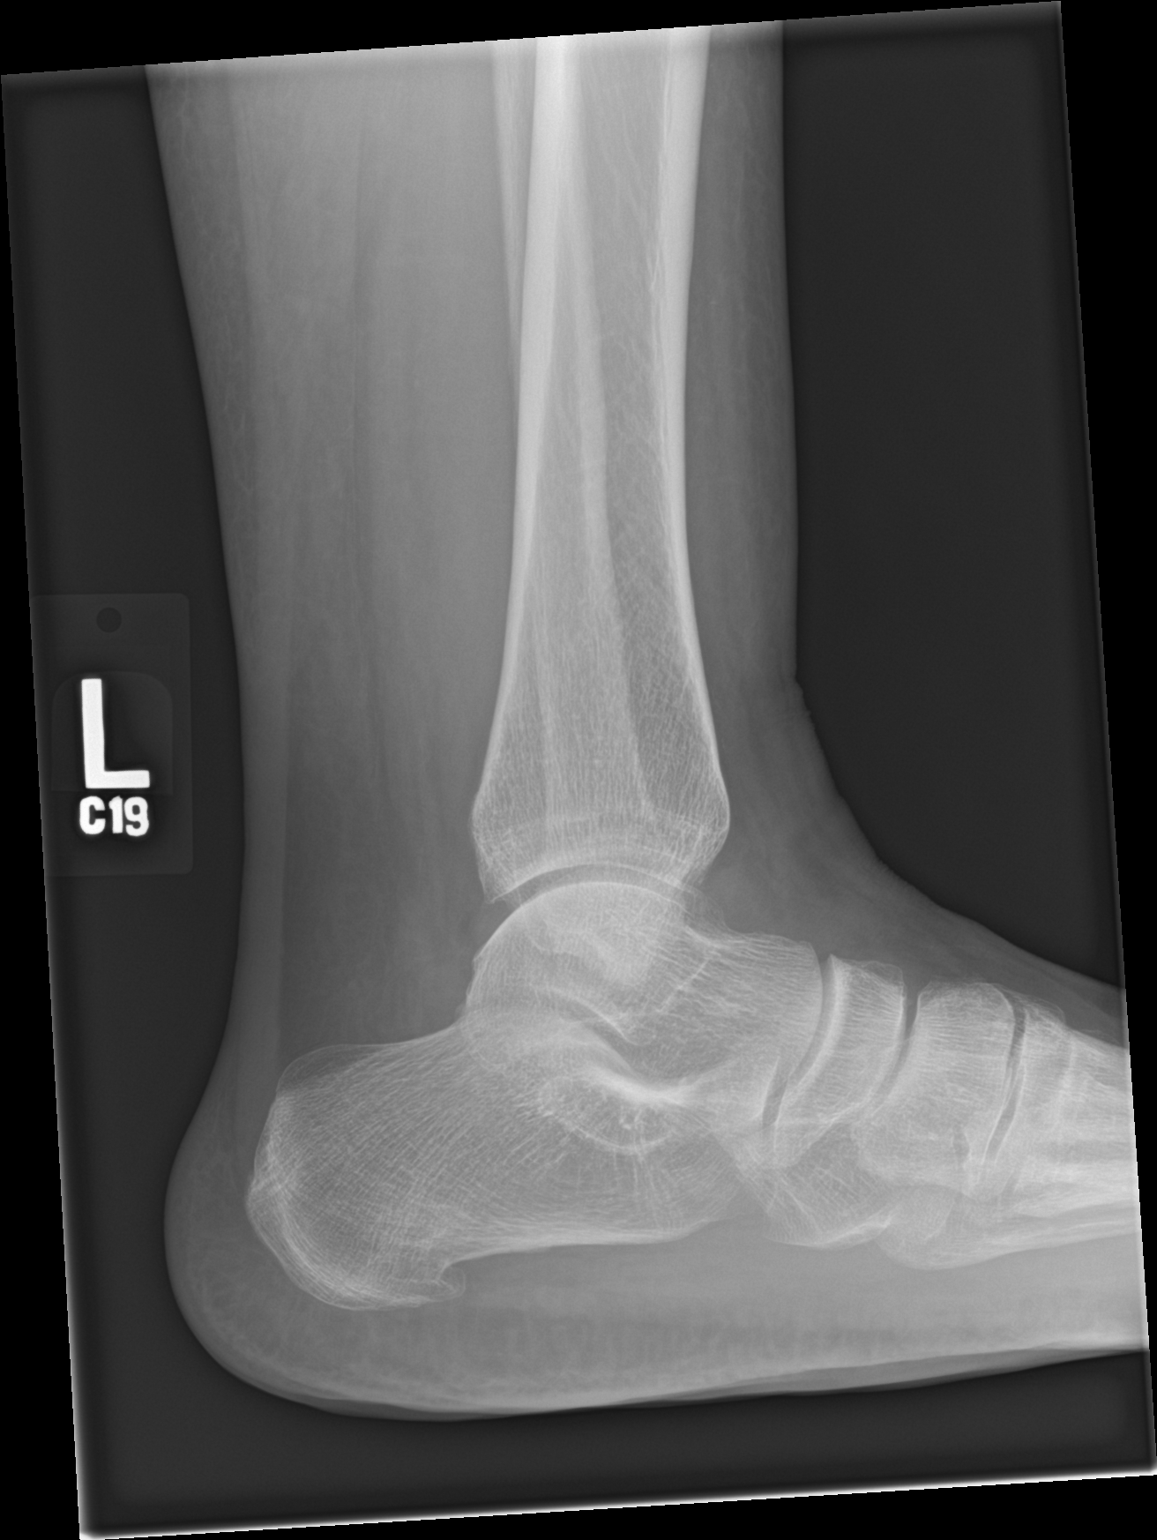

[3 of 3 positions shown; findings below may reference images not displayed]

FINDINGS: Ankle mortise intact. The talar dome is normal. No malleolar
fracture. The calcaneus is normal.
IMPRESSION: No fracture or dislocation.

## 2022-05-15 ENCOUNTER — Emergency Department (HOSPITAL_BASED_OUTPATIENT_CLINIC_OR_DEPARTMENT_OTHER)
Admission: EM | Admit: 2022-05-15 | Discharge: 2022-05-15 | Disposition: A | Discharge status: Home / Self Care | Payer: 59 | Attending: Emergency Medicine | Admitting: Emergency Medicine

## 2022-05-15 ENCOUNTER — Other Ambulatory Visit: Payer: Self-pay

## 2022-05-15 DIAGNOSIS — S161XXA Strain of muscle, fascia and tendon at neck level, initial encounter: Secondary | ICD-10-CM

## 2022-05-15 DIAGNOSIS — S199XXA Unspecified injury of neck, initial encounter: Secondary | ICD-10-CM | POA: Diagnosis present

## 2022-05-15 DIAGNOSIS — X501XXA Overexertion from prolonged static or awkward postures, initial encounter: Secondary | ICD-10-CM | POA: Insufficient documentation

## 2022-05-15 MED ORDER — OXYCODONE-ACETAMINOPHEN 5-325 MG PO TABS: 1.0000 | ORAL_TABLET | Freq: Four times a day (QID) | ORAL | 0 refills | Status: AC | PRN

## 2022-05-15 NOTE — ED Provider Notes (Signed)
[facility] EMERGENCY DEPARTMENT Provider Note   CSN: [id number] Arrival date & time: 05/15/22  0086     History  Chief Complaint  Patient presents with   Neck Pain    Candice Hines is a 51 y.o. female.  She is here with a complaint of 4 days of posterior left-sided neck pain worse with bending twisting.  No numbness or weakness blurry vision double vision fevers or chills.  No trauma.  She said she woke up with it.  Prior history of cerebral aneurysm that was clipped, pain feels nothing like that.  She has tried Tylenol muscle relaxants and lidocaine patch without improvement.  History of bariatric surgery so avoids NSAIDs.  The history is provided by the patient.  Neck Pain Pain location:  Occipital region and L side Quality:  Stabbing and aching Pain radiates to:  L shoulder Pain severity:  Severe Pain is:  Same all the time Onset quality:  Gradual Duration:  4 days Timing:  Constant Progression:  Unchanged Chronicity:  Recurrent Context: not recent injury   Relieved by:  Nothing Worsened by:  Bending Ineffective treatments:  Muscle relaxants, ice and heat Associated symptoms: no bladder incontinence, no bowel incontinence, no chest pain, no fever, no numbness, no photophobia, no visual change and no weakness   Risk factors: no recent head injury        Home Medications Prior to Admission medications   Medication Sig Start Date End Date Taking? Authorizing Provider  atorvastatin (LIPITOR) 40 MG tablet Take by mouth.    [provider]  Biotin 10 MG TABS Take by mouth.    [provider]  Calcium 500-100 MG-UNIT CHEW Calcium 500    [provider]  Cholecalciferol (VITAMIN D PO) Take by mouth.    [provider]  dexlansoprazole (DEXILANT) 60 MG capsule Dexilant 60 mg capsule, delayed release    [provider]  Diclofenac Potassium (CAMBIA) 50 MG PACK 50 mg take at the onset of headache. 07/26/17   Butch Penny, NP  doxycycline (VIBRAMYCIN) 100 MG capsule Take 1 capsule (100 mg total) by mouth 2 (two) times daily. 06/18/20   Avegno, Zachery Dakins, FNP  estradiol (VIVELLE-DOT) 0.1 MG/24HR patch 1 patch 2 (two) times a week. 01/24/20   [provider]  ferrous sulfate 325 (65 FE) MG tablet Take by mouth.    [provider]  fexofenadine (ALLEGRA) 180 MG tablet Take by mouth.    [provider]  ibuprofen (ADVIL) 600 MG tablet Take 1 tablet (600 mg total) by mouth every 6 (six) hours as needed. 10/20/20   Horton, Mayer Masker, MD  ibuprofen (ADVIL) 800 MG tablet Take 1 tablet (800 mg total) by mouth 3 (three) times daily with meals. 02/20/20   Caccavale, Sophia, PA-C  magnesium 30 MG tablet Take 30 mg by mouth 2 (two) times daily.    [provider]  Multiple Vitamins-Minerals (MULTIVITAMIN ADULT PO) multivitamin    [provider]  predniSONE (DELTASONE) 10 MG tablet Take 2 tablets (20 mg total) by mouth daily. 06/18/20   Avegno, Zachery Dakins, FNP  valACYclovir (VALTREX) 1000 MG tablet Take by mouth.    [provider]  VITAMIN A OP Apply to eye.    [provider]      Allergies    Flexeril [cyclobenzaprine] and Tramadol    Review of Systems   Review of Systems  Constitutional:  Negative for fever.  HENT:  Negative for sore throat.  Eyes:  Negative for photophobia.  Respiratory:  Negative for shortness of breath.   Cardiovascular:  Negative for chest pain.  Gastrointestinal:  Negative for bowel incontinence.  Genitourinary:  Negative for bladder incontinence.  Musculoskeletal:  Positive for neck pain.  Neurological:  Negative for weakness and numbness.    Physical Exam Updated Vital Signs BP (!) 150/78   Pulse (!) 53   Temp 97.9 F (36.6 C) (Oral)   Resp 18   Ht 5\' 7"  (1.702 m)   Wt 99.8 kg   SpO2 100%   BMI 34.46 kg/m  Physical Exam Vitals and nursing note reviewed.  Constitutional:      General: She is not in acute  distress.    Appearance: Normal appearance. She is well-developed.  HENT:     Head: Normocephalic and atraumatic.  Eyes:     Conjunctiva/sclera: Conjunctivae normal.  Neck:     Comments: She has some tenderness and spasm in her paracervical neck musculature.  Full range of motion no meningismus. Cardiovascular:     Rate and Rhythm: Normal rate and regular rhythm.     Heart sounds: No murmur heard. Pulmonary:     Effort: Pulmonary effort is normal. No respiratory distress.     Breath sounds: Normal breath sounds.  Abdominal:     Palpations: Abdomen is soft.     Tenderness: There is no abdominal tenderness.  Musculoskeletal:        General: No swelling.     Cervical back: Tenderness present. No rigidity.  Skin:    General: Skin is warm and dry.     Capillary Refill: Capillary refill takes less than 2 seconds.  Neurological:     General: No focal deficit present.     Mental Status: She is alert.     ED Results / Procedures / Treatments   Labs (all labs ordered are listed, but only abnormal results are displayed) Labs Reviewed - No data to display  EKG None  Radiology No results found.  Procedures Procedures    Medications Ordered in ED Medications - No data to display  ED Course/ Medical Decision Making/ A&P                           Medical Decision Making Risk Prescription drug management.   51 year old female here with atraumatic posterior and left-sided neck pain.  Worse with movement.  Clear sensorium no fevers no signs of meningismus.  Doubt subarachnoid or other significant etiology.  Seems very musculoskeletal.  Will treat symptomatically and recommend close follow-up with PCP.  Return instructions discussed        Final Clinical Impression(s) / ED Diagnoses Final diagnoses:  Acute strain of neck muscle, initial encounter    Rx / DC Orders ED Discharge Orders          Ordered    oxyCODONE-acetaminophen (PERCOCET/ROXICET) 5-325 MG tablet   Every 6 hours PRN        05/15/22 0711              05/17/22, MD 05/15/22 [phone]

## 2022-05-15 NOTE — Discharge Instructions (Addendum)
You were seen in the emergency department for posterior left-sided neck pain.  Please continue the muscle relaxant.  We are prescribing some narcotic pain medication.  Continue warm compress.  Gentle stretching.  Follow-up with your primary care doctor.  Return if any worsening or concerning symptoms

## 2022-05-15 NOTE — ED Triage Notes (Addendum)
Pt c/o neck pain and posterior head pain for the past 4 days. Denies injury. Sts she woke up 4 days ago and pain has been persistent since. Has tired Lidocaine patches, heating pad, methocarbamol, tylenol, and ice without relief. Pain worsens with movement, bending down and turning head. Hx of cerebral aneurysm repair.

## 2023-03-28 ENCOUNTER — Other Ambulatory Visit: Payer: Self-pay

## 2023-03-28 ENCOUNTER — Emergency Department (HOSPITAL_BASED_OUTPATIENT_CLINIC_OR_DEPARTMENT_OTHER): Payer: 59

## 2023-03-28 ENCOUNTER — Emergency Department (HOSPITAL_BASED_OUTPATIENT_CLINIC_OR_DEPARTMENT_OTHER)
Admission: EM | Admit: 2023-03-28 | Discharge: 2023-03-28 | Disposition: A | Discharge status: Home / Self Care | Payer: 59 | Attending: Emergency Medicine | Admitting: Emergency Medicine

## 2023-03-28 DIAGNOSIS — I10 Essential (primary) hypertension: Secondary | ICD-10-CM | POA: Diagnosis not present

## 2023-03-28 DIAGNOSIS — R112 Nausea with vomiting, unspecified: Secondary | ICD-10-CM | POA: Insufficient documentation

## 2023-03-28 DIAGNOSIS — R519 Headache, unspecified: Secondary | ICD-10-CM

## 2023-03-28 LAB — MAGNESIUM: Magnesium: 1.8 mg/dL (ref 1.7–2.4)

## 2023-03-28 LAB — BASIC METABOLIC PANEL
Anion gap: 9 (ref 5–15)
BUN: 9 mg/dL (ref 6–20)
CO2: 22 mmol/L (ref 22–32)
Calcium: 9.1 mg/dL (ref 8.9–10.3)
Chloride: 105 mmol/L (ref 98–111)
Creatinine, Ser: 0.75 mg/dL (ref 0.44–1.00)
GFR, Estimated: >60 mL/min (ref >60)
Glucose, Bld: 150 mg/dL — ABNORMAL HIGH (ref 70–99)
Potassium: 4 mmol/L (ref 3.5–5.1)
Sodium: 136 mmol/L (ref 135–145)

## 2023-03-28 LAB — CBC WITH DIFFERENTIAL/PLATELET
Abs Immature Granulocytes: 0.02 10*3/uL (ref 0.00–0.07)
Basophils Absolute: 0 10*3/uL (ref 0.0–0.1)
Basophils Relative: 0 %
Eosinophils Absolute: 0 10*3/uL (ref 0.0–0.5)
Eosinophils Relative: 0 %
HCT: 38 % (ref 36.0–46.0)
Hemoglobin: 12.6 g/dL (ref 12.0–15.0)
Immature Granulocytes: 0 %
Lymphocytes Relative: 8 %
Lymphs Abs: 0.7 10*3/uL (ref 0.7–4.0)
MCH: 31.3 pg (ref 26.0–34.0)
MCHC: 33.2 g/dL (ref 30.0–36.0)
MCV: 94.5 fL (ref 80.0–100.0)
Monocytes Absolute: 0.6 10*3/uL (ref 0.1–1.0)
Monocytes Relative: 7 %
Neutro Abs: 8.1 10*3/uL — ABNORMAL HIGH (ref 1.7–7.7)
Neutrophils Relative %: 85 %
Platelets: 121 10*3/uL — ABNORMAL LOW (ref 150–400)
RBC: 4.02 MIL/uL (ref 3.87–5.11)
RDW: 13 % (ref 11.5–15.5)
WBC: 9.6 10*3/uL (ref 4.0–10.5)
nRBC: 0 % (ref 0.0–0.2)

## 2023-03-28 LAB — HCG, SERUM, QUALITATIVE: Preg, Serum: NEGATIVE

## 2023-03-28 MED ORDER — MAGNESIUM SULFATE IN D5W 1-5 GM/100ML-% IV SOLN
1.0000 g | Freq: Once | INTRAVENOUS | Status: AC
  Administered 2023-03-28: 1 g via INTRAVENOUS
  Filled 2023-03-28: qty 100

## 2023-03-28 MED ORDER — DEXAMETHASONE 4 MG PO TABS
8.0000 mg | ORAL_TABLET | Freq: Once | ORAL | Status: AC
  Administered 2023-03-28: 8 mg via ORAL
  Filled 2023-03-28: qty 2

## 2023-03-28 MED ORDER — ACETAMINOPHEN 500 MG PO TABS
1000.0000 mg | ORAL_TABLET | Freq: Once | ORAL | Status: AC
  Administered 2023-03-28: 1000 mg via ORAL
  Filled 2023-03-28: qty 2

## 2023-03-28 MED ORDER — SODIUM CHLORIDE 0.9 % IV SOLN
12.5000 mg | Freq: Once | INTRAVENOUS | Status: AC
  Administered 2023-03-28: 12.5 mg via INTRAVENOUS
  Filled 2023-03-28: qty 0.5

## 2023-03-28 MED ORDER — DIPHENHYDRAMINE HCL 50 MG/ML IJ SOLN
25.0000 mg | Freq: Once | INTRAMUSCULAR | Status: AC
  Administered 2023-03-28: 25 mg via INTRAVENOUS
  Filled 2023-03-28: qty 1

## 2023-03-28 MED ORDER — PROMETHAZINE HCL 25 MG/ML IJ SOLN
INTRAMUSCULAR | Status: AC
  Filled 2023-03-28: qty 1

## 2023-03-28 MED ORDER — ONDANSETRON HCL 4 MG/2ML IJ SOLN
4.0000 mg | Freq: Once | INTRAMUSCULAR | Status: AC
  Administered 2023-03-28: 4 mg via INTRAVENOUS
  Filled 2023-03-28: qty 2

## 2023-03-28 MED ORDER — PROMETHAZINE HCL 25 MG PO TABS: 25.0000 mg | ORAL_TABLET | Freq: Four times a day (QID) | ORAL | 0 refills | Status: AC | PRN

## 2023-03-28 MED ORDER — SODIUM CHLORIDE 0.9 % IV BOLUS
1000.0000 mL | Freq: Once | INTRAVENOUS | Status: AC
  Administered 2023-03-28: 1000 mL via INTRAVENOUS

## 2023-03-28 MED ORDER — KETOROLAC TROMETHAMINE 15 MG/ML IJ SOLN
15.0000 mg | Freq: Once | INTRAMUSCULAR | Status: AC
  Administered 2023-03-28: 15 mg via INTRAVENOUS
  Filled 2023-03-28: qty 1

## 2023-03-28 NOTE — Discharge Instructions (Addendum)
It was a pleasure caring for you today in the emergency department.  Please return to the emergency department for any worsening or worrisome symptoms.  Be sure to drink plenty of fluids and get plenty of rest over the next few days.  Please follow-up with your primary care doctor for further evaluation of your headaches.  Please return if your headaches become severe, not improved medication, unable to take medications due to vomiting.  Any worsening worrisome symptoms.

## 2023-03-28 NOTE — ED Triage Notes (Signed)
A+Ox4. Ambulatory to triage.  Reports HA starting 0800. Sensitivity to light. Emesis starting about 1hr ago x2 instances. Hx right temporal aneurysm- clip placed 1999.

## 2023-03-28 NOTE — ED Notes (Signed)
Pt A&OX4, verbalized understanding of d/c instructions, prescription and follow up care. Pt wheeled out of ED via wheelchair. 

## 2023-03-28 NOTE — ED Provider Notes (Signed)
[place] EMERGENCY DEPARTMENT AT [facility] Provider Note  CSN: [id number] Arrival date & time: 03/28/23 1520  Chief Complaint(s) Headache and Emesis  HPI Candice Hines is a 52 y.o. female with past medical history as below, significant for right temporal craniectomy, right ICA aneurysm repair, recurrent headaches, hypertension who presents to the ED with complaint of headache.  Patient reports gradual onset of headache since this morning beginning around 8 AM.  Similar to prior headaches, throbbing, pulsing sensation to her forehead and her right side of her face.  Nausea and vomiting earlier which is since subsided.  No vision or hearing changes that seem needed.  No gait disturbance.  No weakness to extremities, no numbness or tingling to her extremities.  No recent head injuries, no fevers. +photophobia  Took home duloxetine without improvement to her symptoms  Past Medical History Past Medical History:  Diagnosis Date   Aneurysm (HCC)    Headache    Hypertension    Patient Active Problem List   Diagnosis Date Noted   Cerebral aneurysm 03/21/2017   Home Medication(s) Prior to Admission medications   Medication Sig Start Date End Date Taking? Authorizing Provider  promethazine (PHENERGAN) 25 MG tablet Take 1 tablet (25 mg total) by mouth every 6 (six) hours as needed for nausea or vomiting. 03/28/23  Yes Tanda Rockers A, DO  atorvastatin (LIPITOR) 40 MG tablet Take by mouth.    [provider]  Biotin 10 MG TABS Take by mouth.    [provider]  Calcium 500-100 MG-UNIT CHEW Calcium 500    [provider]  Cholecalciferol (VITAMIN D PO) Take by mouth.    [provider]  dexlansoprazole (DEXILANT) 60 MG capsule Dexilant 60 mg capsule, delayed release    [provider]  Diclofenac Potassium (CAMBIA) 50 MG PACK 50 mg take at the onset of headache. 07/26/17   Butch Penny, NP  doxycycline (VIBRAMYCIN) 100 MG capsule Take 1  capsule (100 mg total) by mouth 2 (two) times daily. 06/18/20   Avegno, Zachery Dakins, FNP  estradiol (VIVELLE-DOT) 0.1 MG/24HR patch 1 patch 2 (two) times a week. 01/24/20   [provider]  ferrous sulfate 325 (65 FE) MG tablet Take by mouth.    [provider]  fexofenadine (ALLEGRA) 180 MG tablet Take by mouth.    [provider]  ibuprofen (ADVIL) 600 MG tablet Take 1 tablet (600 mg total) by mouth every 6 (six) hours as needed. 10/20/20   Horton, Mayer Masker, MD  ibuprofen (ADVIL) 800 MG tablet Take 1 tablet (800 mg total) by mouth 3 (three) times daily with meals. 02/20/20   Caccavale, Sophia, PA-C  magnesium 30 MG tablet Take 30 mg by mouth 2 (two) times daily.    [provider]  Multiple Vitamins-Minerals (MULTIVITAMIN ADULT PO) multivitamin    [provider]  oxyCODONE-acetaminophen (PERCOCET/ROXICET) 5-325 MG tablet Take 1 tablet by mouth every 6 (six) hours as needed for severe pain. 05/15/22   Terrilee Files, MD  predniSONE (DELTASONE) 10 MG tablet Take 2 tablets (20 mg total) by mouth daily. 06/18/20   Avegno, Zachery Dakins, FNP  valACYclovir (VALTREX) 1000 MG tablet Take by mouth.    [provider]  VITAMIN A OP Apply to eye.    [provider]  Past Surgical History Past Surgical History:  Procedure Laterality Date   ABDOMINAL HYSTERECTOMY     BARIATRIC SURGERY     CEREBRAL ANEURYSM REPAIR     CESAREAN SECTION     x2   FOOT SURGERY     HYSTERECTOMY ABDOMINAL WITH SALPINGECTOMY     SIPS-weight loss sx     Family History Family History  Problem Relation Age of Onset   Hypertension Father    Diabetes Father    Aneurysm Neg Hx    Headache Neg Hx     Social History Social History   Tobacco Use   Smoking status: Never   Smokeless tobacco: Never  Vaping Use   Vaping Use: Never used   Substance Use Topics   Alcohol use: Yes    Comment: occ   Drug use: No   Allergies Flexeril [cyclobenzaprine] and Tramadol  Review of Systems Review of Systems  Constitutional:  Negative for activity change and fever.  HENT:  Negative for facial swelling and trouble swallowing.   Eyes:  Negative for discharge and redness.  Respiratory:  Negative for cough and shortness of breath.   Cardiovascular:  Negative for chest pain and palpitations.  Gastrointestinal:  Positive for nausea and vomiting. Negative for abdominal pain.  Genitourinary:  Negative for dysuria and flank pain.  Musculoskeletal:  Negative for back pain and gait problem.  Skin:  Negative for pallor and rash.  Neurological:  Positive for headaches. Negative for syncope.    Physical Exam Vital Signs  I have reviewed the triage vital signs BP 137/80   Pulse (!) 59   Temp 98.5 F (36.9 C) (Oral)   Resp 16   SpO2 96%  Physical Exam Vitals and nursing note reviewed.  Constitutional:      General: She is not in acute distress.    Appearance: Normal appearance. She is not ill-appearing.  HENT:     Head: Normocephalic and atraumatic.     Right Ear: External ear normal.     Left Ear: External ear normal.     Nose: Nose normal.     Mouth/Throat:     Mouth: Mucous membranes are moist.  Eyes:     General: No scleral icterus.       Right eye: No discharge.        Left eye: No discharge.     Extraocular Movements: Extraocular movements intact.     Pupils: Pupils are equal, round, and reactive to light.  Cardiovascular:     Rate and Rhythm: Normal rate and regular rhythm.     Pulses: Normal pulses.     Heart sounds: Normal heart sounds.  Pulmonary:     Effort: Pulmonary effort is normal. No respiratory distress.     Breath sounds: Normal breath sounds.  Abdominal:     General: Abdomen is flat.     Tenderness: There is no abdominal tenderness.  Musculoskeletal:        General: Normal range of motion.      Cervical back: No rigidity.     Right lower leg: No edema.     Left lower leg: No edema.  Skin:    General: Skin is warm and dry.     Capillary Refill: Capillary refill takes less than 2 seconds.  Neurological:     Mental Status: She is alert and oriented to person, place, and time.     GCS: GCS eye subscore is 4. GCS verbal subscore is 5. GCS motor subscore is  6.     Cranial Nerves: Cranial nerves 2-12 are intact. No dysarthria or facial asymmetry.     Sensory: Sensation is intact.     Motor: Motor function is intact. No weakness or tremor.     Coordination: Coordination is intact.     Comments: Strength 5/5 bilateral upper and lower extremities symmetric   Psychiatric:        Mood and Affect: Mood normal.        Behavior: Behavior normal.     ED Results and Treatments Labs (all labs ordered are listed, but only abnormal results are displayed) Labs Reviewed  CBC WITH DIFFERENTIAL/PLATELET - Abnormal; Notable for the following components:      Result Value   Platelets 121 (*)    Neutro Abs 8.1 (*)    All other components within normal limits  BASIC METABOLIC PANEL - Abnormal; Notable for the following components:   Glucose, Bld 150 (*)    All other components within normal limits  MAGNESIUM  HCG, SERUM, QUALITATIVE                                                                                                                          Radiology CT Head Wo Contrast  Result Date: 03/28/2023 CLINICAL DATA:  Severe headache. EXAM: CT HEAD WITHOUT CONTRAST TECHNIQUE: Contiguous axial images were obtained from the base of the skull through the vertex without intravenous contrast. RADIATION DOSE REDUCTION: This exam was performed according to the departmental dose-optimization program which includes automated exposure control, adjustment of the mA and/or kV according to patient size and/or use of iterative reconstruction technique. COMPARISON:  July 20, 2017 FINDINGS: Brain: No  evidence of acute infarction, hemorrhage, hydrocephalus, extra-axial collection or mass lesion/mass effect. Mild chronic ends foam a lesion in the right temporal lobe. Vascular: Aneurysmal repair at the intra cavernous right ICA is unchanged. Skull: Right temporal craniectomy changes.  No acute lesions. Sinuses/Orbits: No acute finding. Other: None. IMPRESSION: 1. No acute intracranial abnormality. 2. Right temporal craniectomy changes. 3. Aneurysmal repair at the intra cavernous right ICA is unchanged. Electronically Signed   By: Ted Mcalpine M.D.   On: 03/28/2023 16:42    Pertinent labs & imaging results that were available during my care of the patient were reviewed by me and considered in my medical decision making (see MDM for details).  Medications Ordered in ED Medications  promethazine (PHENERGAN) 25 MG/ML injection (has no administration in time range)  ketorolac (TORADOL) 15 MG/ML injection 15 mg (has no administration in time range)  ondansetron (ZOFRAN) injection 4 mg (4 mg Intravenous Given 03/28/23 1546)  sodium chloride 0.9 % bolus 1,000 mL (0 mLs Intravenous Stopped 03/28/23 2110)  promethazine (PHENERGAN) 12.5 mg in sodium chloride 0.9 % 50 mL IVPB (0 mg Intravenous Stopped 03/28/23 2007)  diphenhydrAMINE (BENADRYL) injection 25 mg (25 mg Intravenous Given 03/28/23 1834)  acetaminophen (TYLENOL) tablet 1,000 mg (1,000 mg Oral Given 03/28/23 1832)  magnesium sulfate IVPB  1 g 100 mL (0 g Intravenous Stopped 03/28/23 1853)  dexamethasone (DECADRON) tablet 8 mg (8 mg Oral Given 03/28/23 2112)                                                                                                                                     Procedures Procedures  (including critical care time)  Medical Decision Making / ED Course    Medical Decision Making:    Cayli Brehm is a 52 y.o. female with past medical history as below, significant for right temporal craniectomy, right ICA aneurysm  repair, recurrent headaches, hypertension who presents to the ED with complaint of headache.. The complaint involves an extensive differential diagnosis and also carries with it a high risk of complications and morbidity.  Serious etiology was considered. Ddx includes but is not limited to: Differential diagnosis includes but is not exclusive to subarachnoid hemorrhage, meningitis, encephalitis, previous head trauma, cavernous venous thrombosis, muscle tension headache, glaucoma, temporal arteritis, migraine or migraine equivalent, etc.   Complete initial physical exam performed, notably the patient  was neuroexam is nonfocal, NAD.    Reviewed and confirmed nursing documentation for past medical history, family history, social history.  Vital signs reviewed.    Clinical Course as of 03/28/23 2212  Mon Mar 28, 2023  1931 Symptoms greatly improved, tolerating PO, no further nausea [SG]  2200 Feeling better on recheck, symptoms minimal at this time [SG]    Clinical Course User Index [SG] Sloan Leiter, DO   Labs stable, imaging stable.  Neuroexam is nonfocal.  She is ambulatory.  Tolerating p.o. without difficulty, no further nausea or vomiting. Symptoms greatly improved following intervention, stable for discharge with close outpatient follow-up    Patient presents with headache. Based on the patient's history and physical there is very low clinical suspicion for significant intracranial pathology. The headache was not sudden onset, not maximal at onset, there are no neurologic findings on exam, the patient does not have a fever, the patient does not have any jaw claudication, the patient does not endorse a clotting disorder, patient denies any trauma or eye pain and the headache is not associated with dizziness, weakness on one side of the body, diplopia, vertigo, slurred speech, or ataxia. Given the extremely low risk of these diagnoses further testing and evaluation for these possibilities does  not appear to be indicated at this time.   The patient improved significantly and was discharged in stable condition. Detailed discussions were had with the patient regarding current findings, and need for close f/u with PCP or on call doctor. The patient has been instructed to return immediately if the symptoms worsen in any way for re-evaluation. Patient verbalized understanding and is in agreement with current care plan. All questions answered prior to discharge.            Additional history obtained: -Additional history obtained from na -External records from outside  source obtained and reviewed including: Chart review including previous notes, labs, imaging, consultation notes including medications, prior labs and imaging, prior ED visit, primary care documentation   Lab Tests: -I ordered, reviewed, and interpreted labs.   The pertinent results include:   Labs Reviewed  CBC WITH DIFFERENTIAL/PLATELET - Abnormal; Notable for the following components:      Result Value   Platelets 121 (*)    Neutro Abs 8.1 (*)    All other components within normal limits  BASIC METABOLIC PANEL - Abnormal; Notable for the following components:   Glucose, Bld 150 (*)    All other components within normal limits  MAGNESIUM  HCG, SERUM, QUALITATIVE    Notable for labs stable  EKG   EKG Interpretation  Date/Time:    Ventricular Rate:    PR Interval:    QRS Duration:   QT Interval:    QTC Calculation:   R Axis:     Text Interpretation:           Imaging Studies ordered: I ordered imaging studies including CTH I independently visualized the following imaging with scope of interpretation limited to determining acute life threatening conditions related to emergency care; findings noted above, significant for no large volume ICH or large mass, imaging appears stable I independently visualized and interpreted imaging. I agree with the radiologist interpretation   Medicines ordered  and prescription drug management: Meds ordered this encounter  Medications   ondansetron (ZOFRAN) injection 4 mg   sodium chloride 0.9 % bolus 1,000 mL   promethazine (PHENERGAN) 12.5 mg in sodium chloride 0.9 % 50 mL IVPB   diphenhydrAMINE (BENADRYL) injection 25 mg   acetaminophen (TYLENOL) tablet 1,000 mg   magnesium sulfate IVPB 1 g 100 mL   promethazine (PHENERGAN) 25 MG/ML injection    Terie Purser R: cabinet override   dexamethasone (DECADRON) tablet 8 mg   ketorolac (TORADOL) 15 MG/ML injection 15 mg   promethazine (PHENERGAN) 25 MG tablet    Sig: Take 1 tablet (25 mg total) by mouth every 6 (six) hours as needed for nausea or vomiting.    Dispense:  10 tablet    Refill:  0    -I have reviewed the patients home medicines and have made adjustments as needed   Consultations Obtained: na   Cardiac Monitoring: The patient was maintained on a cardiac monitor.  I personally viewed and interpreted the cardiac monitored which showed an underlying rhythm of: NSR  Social Determinants of Health:  Diagnosis or treatment significantly limited by social determinants of health: non smoker   Reevaluation: After the interventions noted above, I reevaluated the patient and found that they have improved  Co morbidities that complicate the patient evaluation  Past Medical History:  Diagnosis Date   Aneurysm (HCC)    Headache    Hypertension       Dispostion: Disposition decision including need for hospitalization was considered, and patient discharged from emergency department.    Final Clinical Impression(s) / ED Diagnoses Final diagnoses:  Acute nonintractable headache, unspecified headache type     This chart was dictated using voice recognition software.  Despite best efforts to proofread,  errors can occur which can change the documentation meaning.    Sloan Leiter, DO 03/28/23 2212

## 2023-09-15 ENCOUNTER — Other Ambulatory Visit: Payer: Self-pay | Admitting: Family Medicine

## 2023-09-15 DIAGNOSIS — Z1231 Encounter for screening mammogram for malignant neoplasm of breast: Secondary | ICD-10-CM

## 2023-09-16 ENCOUNTER — Ambulatory Visit
Admission: RE | Admit: 2023-09-16 | Discharge: 2023-09-16 | Disposition: A | Discharge status: Home / Self Care | Payer: 59 | Source: Ambulatory Visit | Attending: Family Medicine | Admitting: Family Medicine

## 2023-09-16 DIAGNOSIS — Z1231 Encounter for screening mammogram for malignant neoplasm of breast: Secondary | ICD-10-CM
